# Patient Record
Sex: Female | Born: 1945 | Race: White | Hispanic: No | Smoking: Never smoker
Health system: Southern US, Community
[De-identification: ages and names within clinical notes are randomized; demographics above are authoritative.]

## PROBLEM LIST (undated history)

## (undated) DIAGNOSIS — C50919 Malignant neoplasm of unspecified site of unspecified female breast: Secondary | ICD-10-CM

## (undated) DIAGNOSIS — C801 Malignant (primary) neoplasm, unspecified: Secondary | ICD-10-CM

## (undated) DIAGNOSIS — F329 Major depressive disorder, single episode, unspecified: Secondary | ICD-10-CM

## (undated) DIAGNOSIS — M1990 Unspecified osteoarthritis, unspecified site: Secondary | ICD-10-CM

## (undated) DIAGNOSIS — F32A Depression, unspecified: Secondary | ICD-10-CM

## (undated) HISTORY — DX: Depression, unspecified: F32.A

## (undated) HISTORY — DX: Major depressive disorder, single episode, unspecified: F32.9

## (undated) HISTORY — DX: Malignant (primary) neoplasm, unspecified: C80.1

## (undated) HISTORY — DX: Unspecified osteoarthritis, unspecified site: M19.90

## (undated) HISTORY — DX: Malignant neoplasm of unspecified site of unspecified female breast: C50.919

---

## 1992-10-02 HISTORY — PX: MASTECTOMY: SHX3

## 2005-11-15 ENCOUNTER — Other Ambulatory Visit: Admission: RE | Admit: 2005-11-15 | Discharge: 2005-11-15 | Payer: Self-pay | Admitting: Obstetrics and Gynecology

## 2005-11-28 ENCOUNTER — Encounter: Payer: Self-pay | Admitting: Internal Medicine

## 2005-11-28 ENCOUNTER — Encounter: Admission: RE | Admit: 2005-11-28 | Discharge: 2005-11-28 | Payer: Self-pay | Admitting: Obstetrics and Gynecology

## 2005-12-13 ENCOUNTER — Ambulatory Visit: Payer: Self-pay | Admitting: Internal Medicine

## 2005-12-19 ENCOUNTER — Ambulatory Visit: Payer: Self-pay

## 2005-12-19 ENCOUNTER — Encounter: Payer: Self-pay | Admitting: Internal Medicine

## 2006-01-03 ENCOUNTER — Ambulatory Visit: Payer: Self-pay | Admitting: Internal Medicine

## 2006-12-27 ENCOUNTER — Encounter: Admission: RE | Admit: 2006-12-27 | Discharge: 2006-12-27 | Payer: Self-pay | Admitting: Obstetrics and Gynecology

## 2006-12-28 ENCOUNTER — Ambulatory Visit: Payer: Self-pay | Admitting: Internal Medicine

## 2007-01-14 ENCOUNTER — Encounter: Admission: RE | Admit: 2007-01-14 | Discharge: 2007-01-14 | Payer: Self-pay | Admitting: Obstetrics and Gynecology

## 2007-02-14 ENCOUNTER — Encounter: Payer: Self-pay | Admitting: Internal Medicine

## 2007-02-14 ENCOUNTER — Ambulatory Visit: Payer: Self-pay | Admitting: Internal Medicine

## 2007-02-14 LAB — CONVERTED CEMR LAB
Basophils Absolute: 0 10*3/uL (ref 0.0–0.1)
Basophils Relative: 0.6 % (ref 0.0–1.0)
Eosinophils Relative: 3.1 % (ref 0.0–5.0)
MCHC: 33.9 g/dL (ref 30.0–36.0)
Monocytes Relative: 5.9 % (ref 3.0–11.0)
Platelets: 191 10*3/uL (ref 150–400)
Prothrombin Time: 13 s (ref 11.6–15.2)
RBC: 3.94 M/uL (ref 3.87–5.11)
RDW: 12.2 % (ref 11.5–14.6)
aPTT: 33 s (ref 24–37)

## 2007-02-18 DIAGNOSIS — Z853 Personal history of malignant neoplasm of breast: Secondary | ICD-10-CM | POA: Insufficient documentation

## 2007-03-01 ENCOUNTER — Encounter: Payer: Self-pay | Admitting: Internal Medicine

## 2007-03-01 LAB — HM COLONOSCOPY

## 2007-04-30 ENCOUNTER — Encounter: Payer: Self-pay | Admitting: Internal Medicine

## 2007-05-01 ENCOUNTER — Telehealth (INDEPENDENT_AMBULATORY_CARE_PROVIDER_SITE_OTHER): Payer: Self-pay | Admitting: *Deleted

## 2007-07-24 ENCOUNTER — Ambulatory Visit: Payer: Self-pay | Admitting: Internal Medicine

## 2007-07-24 DIAGNOSIS — M159 Polyosteoarthritis, unspecified: Secondary | ICD-10-CM | POA: Insufficient documentation

## 2007-07-24 DIAGNOSIS — M25469 Effusion, unspecified knee: Secondary | ICD-10-CM

## 2007-09-19 ENCOUNTER — Ambulatory Visit: Payer: Self-pay | Admitting: Internal Medicine

## 2007-10-01 ENCOUNTER — Encounter: Payer: Self-pay | Admitting: Internal Medicine

## 2007-10-03 HISTORY — PX: BREAST LUMPECTOMY: SHX2

## 2007-10-03 HISTORY — PX: COLONOSCOPY: SHX174

## 2007-10-03 HISTORY — PX: KNEE ARTHROSCOPY: SUR90

## 2007-10-24 ENCOUNTER — Encounter: Payer: Self-pay | Admitting: Internal Medicine

## 2008-01-15 ENCOUNTER — Encounter: Admission: RE | Admit: 2008-01-15 | Discharge: 2008-01-15 | Payer: Self-pay | Admitting: Internal Medicine

## 2008-01-15 ENCOUNTER — Encounter: Payer: Self-pay | Admitting: Internal Medicine

## 2008-01-21 ENCOUNTER — Encounter (INDEPENDENT_AMBULATORY_CARE_PROVIDER_SITE_OTHER): Payer: Self-pay | Admitting: *Deleted

## 2008-01-27 ENCOUNTER — Encounter (INDEPENDENT_AMBULATORY_CARE_PROVIDER_SITE_OTHER): Payer: Self-pay | Admitting: Diagnostic Radiology

## 2008-01-27 ENCOUNTER — Encounter: Payer: Self-pay | Admitting: Internal Medicine

## 2008-01-27 ENCOUNTER — Encounter: Admission: RE | Admit: 2008-01-27 | Discharge: 2008-01-27 | Payer: Self-pay | Admitting: Internal Medicine

## 2008-01-28 ENCOUNTER — Telehealth: Payer: Self-pay | Admitting: Internal Medicine

## 2008-01-29 ENCOUNTER — Telehealth: Payer: Self-pay | Admitting: Internal Medicine

## 2008-01-29 ENCOUNTER — Encounter: Payer: Self-pay | Admitting: Internal Medicine

## 2008-01-30 ENCOUNTER — Telehealth: Payer: Self-pay | Admitting: Internal Medicine

## 2008-01-31 ENCOUNTER — Ambulatory Visit: Payer: Self-pay | Admitting: Internal Medicine

## 2008-02-05 ENCOUNTER — Encounter: Payer: Self-pay | Admitting: Internal Medicine

## 2008-02-05 ENCOUNTER — Encounter: Admission: RE | Admit: 2008-02-05 | Discharge: 2008-02-05 | Payer: Self-pay | Admitting: Internal Medicine

## 2008-02-07 ENCOUNTER — Ambulatory Visit: Payer: Self-pay | Admitting: Oncology

## 2008-02-11 ENCOUNTER — Encounter: Admission: RE | Admit: 2008-02-11 | Discharge: 2008-02-11 | Payer: Self-pay | Admitting: Obstetrics and Gynecology

## 2008-02-11 ENCOUNTER — Ambulatory Visit (HOSPITAL_BASED_OUTPATIENT_CLINIC_OR_DEPARTMENT_OTHER): Admission: RE | Admit: 2008-02-11 | Discharge: 2008-02-11 | Payer: Self-pay | Admitting: Surgery

## 2008-02-11 ENCOUNTER — Encounter (INDEPENDENT_AMBULATORY_CARE_PROVIDER_SITE_OTHER): Payer: Self-pay | Admitting: *Deleted

## 2008-02-11 ENCOUNTER — Encounter: Payer: Self-pay | Admitting: Internal Medicine

## 2008-02-11 ENCOUNTER — Encounter (INDEPENDENT_AMBULATORY_CARE_PROVIDER_SITE_OTHER): Payer: Self-pay | Admitting: Surgery

## 2008-03-04 ENCOUNTER — Encounter: Payer: Self-pay | Admitting: Internal Medicine

## 2008-03-04 LAB — CBC WITH DIFFERENTIAL/PLATELET
EOS%: 3.5 % (ref 0.0–7.0)
LYMPH%: 36 % (ref 14.0–48.0)
MCH: 31.5 pg (ref 26.0–34.0)
MCHC: 34.7 g/dL (ref 32.0–36.0)
MCV: 90.8 fL (ref 81.0–101.0)
MONO%: 8.5 % (ref 0.0–13.0)
Platelets: 203 10*3/uL (ref 145–400)
RBC: 3.78 10*6/uL (ref 3.70–5.32)
RDW: 13 % (ref 11.3–14.5)

## 2008-03-05 ENCOUNTER — Ambulatory Visit (HOSPITAL_COMMUNITY): Admission: RE | Admit: 2008-03-05 | Discharge: 2008-03-05 | Payer: Self-pay | Admitting: Oncology

## 2008-03-05 LAB — COMPREHENSIVE METABOLIC PANEL
AST: 18 U/L (ref 0–37)
Albumin: 4.1 g/dL (ref 3.5–5.2)
Alkaline Phosphatase: 69 U/L (ref 39–117)
Potassium: 3.5 mEq/L (ref 3.5–5.3)
Sodium: 138 mEq/L (ref 135–145)
Total Bilirubin: 0.5 mg/dL (ref 0.3–1.2)
Total Protein: 6.8 g/dL (ref 6.0–8.3)

## 2008-03-05 LAB — VITAMIN D 25 HYDROXY (VIT D DEFICIENCY, FRACTURES): Vit D, 25-Hydroxy: 23 ng/mL — ABNORMAL LOW (ref 30–89)

## 2008-03-11 ENCOUNTER — Ambulatory Visit: Admission: RE | Admit: 2008-03-11 | Discharge: 2008-06-09 | Payer: Self-pay | Admitting: Radiation Oncology

## 2008-03-12 ENCOUNTER — Encounter: Payer: Self-pay | Admitting: Internal Medicine

## 2008-03-26 ENCOUNTER — Ambulatory Visit: Payer: Self-pay | Admitting: Oncology

## 2008-03-26 ENCOUNTER — Encounter: Payer: Self-pay | Admitting: Internal Medicine

## 2008-05-22 ENCOUNTER — Ambulatory Visit: Payer: Self-pay | Admitting: Oncology

## 2008-05-27 LAB — COMPREHENSIVE METABOLIC PANEL
ALT: 21 U/L (ref 0–35)
AST: 23 U/L (ref 0–37)
Albumin: 4.3 g/dL (ref 3.5–5.2)
BUN: 9 mg/dL (ref 6–23)
Calcium: 9.6 mg/dL (ref 8.4–10.5)
Chloride: 105 mEq/L (ref 96–112)
Potassium: 3.9 mEq/L (ref 3.5–5.3)
Sodium: 141 mEq/L (ref 135–145)
Total Protein: 7.3 g/dL (ref 6.0–8.3)

## 2008-05-28 ENCOUNTER — Ambulatory Visit: Payer: Self-pay | Admitting: Internal Medicine

## 2008-05-28 DIAGNOSIS — H9319 Tinnitus, unspecified ear: Secondary | ICD-10-CM | POA: Insufficient documentation

## 2008-06-02 ENCOUNTER — Encounter: Payer: Self-pay | Admitting: Internal Medicine

## 2008-10-22 ENCOUNTER — Ambulatory Visit: Payer: Self-pay | Admitting: Internal Medicine

## 2008-11-05 ENCOUNTER — Encounter: Payer: Self-pay | Admitting: Internal Medicine

## 2008-11-26 ENCOUNTER — Encounter: Admission: RE | Admit: 2008-11-26 | Discharge: 2009-01-25 | Payer: Self-pay | Admitting: Orthopedic Surgery

## 2008-11-26 ENCOUNTER — Ambulatory Visit: Payer: Self-pay | Admitting: Oncology

## 2008-11-30 ENCOUNTER — Encounter: Payer: Self-pay | Admitting: Internal Medicine

## 2008-11-30 LAB — CBC WITH DIFFERENTIAL/PLATELET
Basophils Absolute: 0 10*3/uL (ref 0.0–0.1)
Eosinophils Absolute: 0.1 10*3/uL (ref 0.0–0.5)
HGB: 12.9 g/dL (ref 11.6–15.9)
MCV: 90.2 fL (ref 79.5–101.0)
MONO%: 6.1 % (ref 0.0–14.0)
NEUT#: 4.7 10*3/uL (ref 1.5–6.5)
RBC: 4.18 10*6/uL (ref 3.70–5.45)
RDW: 13.6 % (ref 11.2–14.5)
WBC: 6.7 10*3/uL (ref 3.9–10.3)
lymph#: 1.5 10*3/uL (ref 0.9–3.3)

## 2008-12-01 LAB — COMPREHENSIVE METABOLIC PANEL
ALT: 19 U/L (ref 0–35)
BUN: 12 mg/dL (ref 6–23)
CO2: 22 mEq/L (ref 19–32)
Creatinine, Ser: 0.64 mg/dL (ref 0.40–1.20)
Total Bilirubin: 0.3 mg/dL (ref 0.3–1.2)

## 2008-12-01 LAB — CANCER ANTIGEN 27.29: CA 27.29: 18 U/mL (ref 0–39)

## 2008-12-10 ENCOUNTER — Encounter: Payer: Self-pay | Admitting: Internal Medicine

## 2008-12-18 ENCOUNTER — Ambulatory Visit: Payer: Self-pay | Admitting: Internal Medicine

## 2008-12-31 ENCOUNTER — Ambulatory Visit: Payer: Self-pay | Admitting: Internal Medicine

## 2008-12-31 DIAGNOSIS — M79609 Pain in unspecified limb: Secondary | ICD-10-CM | POA: Insufficient documentation

## 2008-12-31 DIAGNOSIS — M949 Disorder of cartilage, unspecified: Secondary | ICD-10-CM

## 2008-12-31 DIAGNOSIS — M899 Disorder of bone, unspecified: Secondary | ICD-10-CM | POA: Insufficient documentation

## 2009-01-05 ENCOUNTER — Encounter (INDEPENDENT_AMBULATORY_CARE_PROVIDER_SITE_OTHER): Payer: Self-pay | Admitting: *Deleted

## 2009-01-19 ENCOUNTER — Encounter: Admission: RE | Admit: 2009-01-19 | Discharge: 2009-01-19 | Payer: Self-pay | Admitting: Radiation Oncology

## 2009-02-08 ENCOUNTER — Telehealth (INDEPENDENT_AMBULATORY_CARE_PROVIDER_SITE_OTHER): Payer: Self-pay | Admitting: *Deleted

## 2009-03-02 ENCOUNTER — Ambulatory Visit: Payer: Self-pay | Admitting: Oncology

## 2009-03-04 ENCOUNTER — Encounter: Payer: Self-pay | Admitting: Internal Medicine

## 2009-03-04 LAB — COMPREHENSIVE METABOLIC PANEL
Albumin: 3.7 g/dL (ref 3.5–5.2)
Alkaline Phosphatase: 94 U/L (ref 39–117)
BUN: 12 mg/dL (ref 6–23)
CO2: 29 mEq/L (ref 19–32)
Calcium: 8.8 mg/dL (ref 8.4–10.5)
Chloride: 105 mEq/L (ref 96–112)
Glucose, Bld: 94 mg/dL (ref 70–99)
Potassium: 3.7 mEq/L (ref 3.5–5.3)
Sodium: 138 mEq/L (ref 135–145)
Total Protein: 6.9 g/dL (ref 6.0–8.3)

## 2009-03-04 LAB — CBC WITH DIFFERENTIAL/PLATELET
Basophils Absolute: 0 10*3/uL (ref 0.0–0.1)
Eosinophils Absolute: 0.2 10*3/uL (ref 0.0–0.5)
HGB: 12.3 g/dL (ref 11.6–15.9)
MCV: 90.8 fL (ref 79.5–101.0)
MONO#: 0.4 10*3/uL (ref 0.1–0.9)
MONO%: 8.3 % (ref 0.0–14.0)
NEUT#: 3.1 10*3/uL (ref 1.5–6.5)
RBC: 3.98 10*6/uL (ref 3.70–5.45)
RDW: 12.9 % (ref 11.2–14.5)
WBC: 5.2 10*3/uL (ref 3.9–10.3)
lymph#: 1.4 10*3/uL (ref 0.9–3.3)

## 2009-03-15 ENCOUNTER — Ambulatory Visit: Payer: Self-pay | Admitting: Internal Medicine

## 2009-03-15 DIAGNOSIS — R110 Nausea: Secondary | ICD-10-CM

## 2009-03-16 ENCOUNTER — Encounter: Payer: Self-pay | Admitting: Internal Medicine

## 2009-03-20 LAB — CONVERTED CEMR LAB
ALT: 19 units/L (ref 0–35)
Albumin: 3.9 g/dL (ref 3.5–5.2)
Alkaline Phosphatase: 92 units/L (ref 39–117)
BUN: 15 mg/dL (ref 6–23)
Basophils Absolute: 0 10*3/uL (ref 0.0–0.1)
Bilirubin, Direct: 0.1 mg/dL (ref 0.0–0.3)
Calcium: 9.3 mg/dL (ref 8.4–10.5)
Chloride: 100 meq/L (ref 96–112)
Creatinine, Ser: 0.7 mg/dL (ref 0.4–1.2)
Eosinophils Relative: 2.2 % (ref 0.0–5.0)
Hemoglobin: 12.5 g/dL (ref 12.0–15.0)
Lymphocytes Relative: 24.2 % (ref 12.0–46.0)
Lymphs Abs: 1.5 10*3/uL (ref 0.7–4.0)
Neutro Abs: 4.1 10*3/uL (ref 1.4–7.7)
Neutrophils Relative %: 68.3 % (ref 43.0–77.0)
Platelets: 176 10*3/uL (ref 150.0–400.0)
Potassium: 4.2 meq/L (ref 3.5–5.1)
RDW: 12 % (ref 11.5–14.6)
Sodium: 139 meq/L (ref 135–145)
Total Bilirubin: 0.7 mg/dL (ref 0.3–1.2)

## 2009-03-23 ENCOUNTER — Encounter (INDEPENDENT_AMBULATORY_CARE_PROVIDER_SITE_OTHER): Payer: Self-pay | Admitting: *Deleted

## 2009-06-23 ENCOUNTER — Ambulatory Visit: Payer: Self-pay | Admitting: Internal Medicine

## 2009-06-23 DIAGNOSIS — M6740 Ganglion, unspecified site: Secondary | ICD-10-CM | POA: Insufficient documentation

## 2009-07-08 ENCOUNTER — Ambulatory Visit: Payer: Self-pay | Admitting: Internal Medicine

## 2009-07-29 ENCOUNTER — Encounter: Payer: Self-pay | Admitting: Internal Medicine

## 2009-08-18 ENCOUNTER — Encounter: Payer: Self-pay | Admitting: Internal Medicine

## 2009-09-08 ENCOUNTER — Encounter: Payer: Self-pay | Admitting: Internal Medicine

## 2009-09-13 ENCOUNTER — Ambulatory Visit: Payer: Self-pay | Admitting: Internal Medicine

## 2009-09-13 DIAGNOSIS — F39 Unspecified mood [affective] disorder: Secondary | ICD-10-CM | POA: Insufficient documentation

## 2009-09-14 ENCOUNTER — Encounter (INDEPENDENT_AMBULATORY_CARE_PROVIDER_SITE_OTHER): Payer: Self-pay | Admitting: *Deleted

## 2009-09-14 LAB — CONVERTED CEMR LAB
ALT: 19 units/L (ref 0–35)
AST: 25 units/L (ref 0–37)
BUN: 12 mg/dL (ref 6–23)
Basophils Absolute: 0 10*3/uL (ref 0.0–0.1)
Bilirubin, Direct: 0.1 mg/dL (ref 0.0–0.3)
Calcium: 9.1 mg/dL (ref 8.4–10.5)
Cholesterol: 212 mg/dL — ABNORMAL HIGH (ref 0–200)
Creatinine, Ser: 0.6 mg/dL (ref 0.4–1.2)
GFR calc non Af Amer: 107.21 mL/min (ref >60)
HCT: 38.4 % (ref 36.0–46.0)
Hemoglobin: 13.2 g/dL (ref 12.0–15.0)
Lymphs Abs: 1.4 10*3/uL (ref 0.7–4.0)
Monocytes Absolute: 0.4 10*3/uL (ref 0.1–1.0)
Neutrophils Relative %: 66.6 % (ref 43.0–77.0)
RDW: 13.1 % (ref 11.5–14.6)
Sodium: 142 meq/L (ref 135–145)
TSH: 1.2 microintl units/mL (ref 0.35–5.50)
Total Bilirubin: 0.9 mg/dL (ref 0.3–1.2)
Total CHOL/HDL Ratio: 2
WBC: 5.8 10*3/uL (ref 4.5–10.5)

## 2009-10-27 ENCOUNTER — Ambulatory Visit: Payer: Self-pay | Admitting: Oncology

## 2009-10-27 LAB — COMPREHENSIVE METABOLIC PANEL
ALT: 23 U/L (ref 0–35)
AST: 21 U/L (ref 0–37)
CO2: 31 mEq/L (ref 19–32)
Calcium: 9.1 mg/dL (ref 8.4–10.5)
Chloride: 102 mEq/L (ref 96–112)
Creatinine, Ser: 0.71 mg/dL (ref 0.40–1.20)
Potassium: 3.7 mEq/L (ref 3.5–5.3)
Sodium: 139 mEq/L (ref 135–145)
Total Protein: 7.4 g/dL (ref 6.0–8.3)

## 2009-10-27 LAB — CBC WITH DIFFERENTIAL/PLATELET
BASO%: 0.3 % (ref 0.0–2.0)
Basophils Absolute: 0 10*3/uL (ref 0.0–0.1)
EOS%: 1.8 % (ref 0.0–7.0)
HGB: 12.9 g/dL (ref 11.6–15.9)
MCH: 31.9 pg (ref 25.1–34.0)
MCHC: 34.1 g/dL (ref 31.5–36.0)
MCV: 93.5 fL (ref 79.5–101.0)
MONO%: 8.1 % (ref 0.0–14.0)
NEUT%: 55.6 % (ref 38.4–76.8)
RDW: 12.8 % (ref 11.2–14.5)
lymph#: 1.9 10*3/uL (ref 0.9–3.3)

## 2009-10-27 LAB — CANCER ANTIGEN 27.29: CA 27.29: 16 U/mL (ref 0–39)

## 2009-11-03 ENCOUNTER — Encounter: Payer: Self-pay | Admitting: Internal Medicine

## 2009-11-15 ENCOUNTER — Inpatient Hospital Stay (HOSPITAL_COMMUNITY): Admission: RE | Admit: 2009-11-15 | Discharge: 2009-11-17 | Payer: Self-pay | Admitting: Orthopedic Surgery

## 2009-11-15 HISTORY — PX: JOINT REPLACEMENT: SHX530

## 2009-12-02 ENCOUNTER — Encounter: Admission: RE | Admit: 2009-12-02 | Discharge: 2010-01-25 | Payer: Self-pay | Admitting: Orthopedic Surgery

## 2010-01-20 ENCOUNTER — Encounter: Admission: RE | Admit: 2010-01-20 | Discharge: 2010-01-20 | Payer: Self-pay | Admitting: Oncology

## 2010-01-20 LAB — HM MAMMOGRAPHY

## 2010-02-28 ENCOUNTER — Ambulatory Visit: Payer: Self-pay | Admitting: Diagnostic Radiology

## 2010-02-28 ENCOUNTER — Emergency Department (HOSPITAL_BASED_OUTPATIENT_CLINIC_OR_DEPARTMENT_OTHER): Admission: EM | Admit: 2010-02-28 | Discharge: 2010-02-28 | Payer: Self-pay | Admitting: Emergency Medicine

## 2010-03-31 ENCOUNTER — Encounter: Payer: Self-pay | Admitting: Internal Medicine

## 2010-04-29 ENCOUNTER — Ambulatory Visit: Payer: Self-pay | Admitting: Oncology

## 2010-05-03 LAB — CBC WITH DIFFERENTIAL/PLATELET
Basophils Absolute: 0 10*3/uL (ref 0.0–0.1)
EOS%: 1.8 % (ref 0.0–7.0)
Eosinophils Absolute: 0.1 10*3/uL (ref 0.0–0.5)
LYMPH%: 31.4 % (ref 14.0–49.7)
MCH: 32.1 pg (ref 25.1–34.0)
MCV: 92.8 fL (ref 79.5–101.0)
MONO%: 7.4 % (ref 0.0–14.0)
NEUT#: 3.3 10*3/uL (ref 1.5–6.5)
Platelets: 181 10*3/uL (ref 145–400)
RBC: 3.95 10*6/uL (ref 3.70–5.45)

## 2010-05-03 LAB — COMPREHENSIVE METABOLIC PANEL
AST: 23 U/L (ref 0–37)
Alkaline Phosphatase: 87 U/L (ref 39–117)
BUN: 14 mg/dL (ref 6–23)
Glucose, Bld: 90 mg/dL (ref 70–99)
Sodium: 138 mEq/L (ref 135–145)
Total Bilirubin: 0.8 mg/dL (ref 0.3–1.2)

## 2010-05-04 LAB — VITAMIN D 25 HYDROXY (VIT D DEFICIENCY, FRACTURES): Vit D, 25-Hydroxy: 31 ng/mL (ref 30–89)

## 2010-05-09 ENCOUNTER — Encounter: Payer: Self-pay | Admitting: Internal Medicine

## 2010-05-16 ENCOUNTER — Encounter: Payer: Self-pay | Admitting: Internal Medicine

## 2010-07-01 ENCOUNTER — Encounter: Payer: Self-pay | Admitting: Internal Medicine

## 2010-08-02 ENCOUNTER — Encounter: Payer: Self-pay | Admitting: Internal Medicine

## 2010-10-23 ENCOUNTER — Encounter: Payer: Self-pay | Admitting: Obstetrics and Gynecology

## 2010-10-23 ENCOUNTER — Encounter: Payer: Self-pay | Admitting: Radiation Oncology

## 2010-11-01 NOTE — Letter (Signed)
[facility]   Imported By: Lanelle Bal 11/20/2009 09:48:19  _____________________________________________________________________  External Attachment:    Type:   Image     Comment:   External Document

## 2010-11-01 NOTE — Letter (Signed)
[facility]   Imported By: Lanelle Bal 05/30/2010 12:23:15  _____________________________________________________________________  External Attachment:    Type:   Image     Comment:   External Document

## 2010-11-01 NOTE — Miscellaneous (Signed)
Summary: Orders Update  Clinical Lists Changes  Medications: Removed medication of CITALOPRAM HYDROBROMIDE 20 MG TABS (CITALOPRAM HYDROBROMIDE) 1 q am; do not take within 8 hrs of Tramadol Added new medication of FLUOXETINE HCL 20 MG CAPS (FLUOXETINE HCL) 1 once daily - Signed Rx of FLUOXETINE HCL 20 MG CAPS (FLUOXETINE HCL) 1 once daily;  #30 x 5;  Signed;  Entered by: Marga Melnick MD;  Authorized by: Marga Melnick MD;  Method used: Print then Give to Patient    Prescriptions: FLUOXETINE HCL 20 MG CAPS (FLUOXETINE HCL) 1 once daily  #30 x 5   Entered and Authorized by:   Marga Melnick MD   Signed by:   Marga Melnick MD on 08/02/2010   Method used:   Print then Give to Patient   RxID:   [id number]

## 2010-11-01 NOTE — Miscellaneous (Signed)
Summary: Living Will  Living Will   Imported By: Lanelle Bal 08/16/2010 11:46:19  _____________________________________________________________________  External Attachment:    Type:   Image     Comment:   External Document

## 2010-11-01 NOTE — Letter (Signed)
Summary: [facility]  [facility]   Imported By: Lanelle Bal 05/26/2010 14:15:06  _____________________________________________________________________  External Attachment:    Type:   Image     Comment:   External Document

## 2010-11-01 NOTE — Letter (Signed)
Summary: [facility]  [facility]   Imported By: Lanelle Bal 04/11/2010 09:13:46  _____________________________________________________________________  External Attachment:    Type:   Image     Comment:   External Document

## 2010-11-01 NOTE — Miscellaneous (Signed)
Summary: [facility] Care Power of [facility] Power of Attorney   Imported By: Lanelle Bal 08/16/2010 11:48:59  _____________________________________________________________________  External Attachment:    Type:   Image     Comment:   External Document

## 2010-11-14 ENCOUNTER — Encounter: Payer: Self-pay | Admitting: Internal Medicine

## 2010-11-29 ENCOUNTER — Encounter: Payer: Self-pay | Admitting: Internal Medicine

## 2010-11-29 ENCOUNTER — Ambulatory Visit (INDEPENDENT_AMBULATORY_CARE_PROVIDER_SITE_OTHER): Payer: Federal, State, Local not specified - PPO | Admitting: Internal Medicine

## 2010-11-29 DIAGNOSIS — J019 Acute sinusitis, unspecified: Secondary | ICD-10-CM

## 2010-11-29 DIAGNOSIS — J209 Acute bronchitis, unspecified: Secondary | ICD-10-CM

## 2010-11-29 NOTE — Letter (Signed)
Summary: [facility]   Imported By: Maryln Gottron 11/23/2010 10:41:48  _____________________________________________________________________  External Attachment:    Type:   Image     Comment:   External Document

## 2010-12-08 NOTE — Assessment & Plan Note (Signed)
Summary: cold , cough for 3-4 days///sph   Vital Signs:  Patient profile:   65 year old female Weight:      155.2 pounds BMI:     27.37 Temp:     98.2 degrees F oral Pulse rate:   76 / minute Resp:     14 per minute BP sitting:   110 / 60  (right arm) Cuff size:   large  Vitals Entered By: Shonna Chock CMA (November 29, 2010 8:03 AM) CC: URI symptoms x 1 week   CC:  URI symptoms x 1 week.  History of Present Illness:    Onset as 11/21/2010 as ST  with facial pain, low grade fever , & rhinitis with clear secretions.Now she describes copious  purulent nasal discharge and productive cough with some dyspnea, but denies earache.  The patient denies fever,  and wheezing.  The patient denies headache.  The patient denies the following risk factors for Strep sinusitis: unilateral facial pain, tooth pain, and tender adenopathy. No PMH of asthma; non smoker. Rx: Nyquil  Current Medications (verified): 1)  Boniva 150 Mg  Tabs (Ibandronate Sodium) .... Take One Tablet Monthly 2)  Arimidex 1 Mg Tabs (Anastrozole) .Marland Kitchen.. 1 By Mouth Once Daily 3)  Fluoxetine Hcl 20 Mg Caps (Fluoxetine Hcl) .Marland Kitchen.. 1 Once Daily  Allergies (verified): No Known Drug Allergies  Physical Exam  General:  well-nourished,in no acute distress; alert,appropriate and cooperative throughout examination Ears:  External ear exam shows no significant lesions or deformities.  Otoscopic examination reveals clear canals, tympanic membranes are intact bilaterally without bulging, retraction, inflammation or discharge. Hearing is grossly normal bilaterally. Nose:  External nasal examination shows no deformity or inflammation. Nasal mucosa are  slightly dry without lesions or exudates. Mouth:  Oral mucosa and oropharynx without lesions or exudates.  Teeth in good repair. Lungs:  Normal respiratory effort, chest expands symmetrically. Lungs : minima rhonchi , no crackles or wheezes.Harsh barking cough Heart:  Normal rate and regular  rhythm. S1 and S2 normal without gallop, murmur, click, rub . Cervical Nodes:  No lymphadenopathy noted Axillary Nodes:  No palpable lymphadenopathy   Impression & Recommendations:  Problem # 1:  BRONCHITIS-ACUTE (ICD-466.0)  Her updated medication list for this problem includes:    Amoxicillin-pot Clavulanate 875-125 Mg Tabs (Amoxicillin-pot clavulanate) .Marland Kitchen... 1 every 12 hrs with a meal    Hydromet 5-1.5 Mg/78ml Syrp (Hydrocodone-homatropine) .Marland Kitchen... 1 tsp every 6 hrs as needed    Advair Diskus 100-50 Mcg/dose Aepb (Fluticasone-salmeterol) .Marland Kitchen... 1 inhalation every 12 hrs ; gargle & spit after use  Problem # 2:  SINUSITIS- ACUTE-NOS (ICD-461.9)  Her updated medication list for this problem includes:    Amoxicillin-pot Clavulanate 875-125 Mg Tabs (Amoxicillin-pot clavulanate) .Marland Kitchen... 1 every 12 hrs with a meal    Hydromet 5-1.5 Mg/37ml Syrp (Hydrocodone-homatropine) .Marland Kitchen... 1 tsp every 6 hrs as needed  Complete Medication List: 1)  Boniva 150 Mg Tabs (Ibandronate sodium) .... Take one tablet monthly 2)  Arimidex 1 Mg Tabs (Anastrozole) .Marland Kitchen.. 1 by mouth once daily 3)  Fluoxetine Hcl 20 Mg Caps (Fluoxetine hcl) .Marland Kitchen.. 1 once daily 4)  Amoxicillin-pot Clavulanate 875-125 Mg Tabs (Amoxicillin-pot clavulanate) .Marland Kitchen.. 1 every 12 hrs with a meal 5)  Prednisone 20 Mg Tabs (Prednisone) .Marland Kitchen.. 1 two times a day with food 6)  Hydromet 5-1.5 Mg/54ml Syrp (Hydrocodone-homatropine) .Marland Kitchen.. 1 tsp every 6 hrs as needed 7)  Advair Diskus 100-50 Mcg/dose Aepb (Fluticasone-salmeterol) .Marland Kitchen.. 1 inhalation every 12 hrs ; gargle & spit  after use  Patient Instructions: 1)  Recommended remaining out of work for 11/29/2010. 2)  Drink as much fluid as you can tolerate for the next few days. Prescriptions: ADVAIR DISKUS 100-50 MCG/DOSE AEPB (FLUTICASONE-SALMETEROL) 1 inhalation every 12 hrs ; gargle & spit after use  #1 x 0   Entered and Authorized by:   Marga Melnick MD   Signed by:   Marga Melnick MD on 11/29/2010   Method  used:   Samples Given   RxID:   [id number] HYDROMET 5-1.5 MG/5ML SYRP (HYDROCODONE-HOMATROPINE) 1 tsp every 6 hrs as needed  #120cc x 0   Entered and Authorized by:   Marga Melnick MD   Signed by:   Marga Melnick MD on 11/29/2010   Method used:   Printed then faxed to ...       [facility] [phone]* (retail)       [address]       [place], [place]  [address]       Ph: [phone]       Fax: [phone]   RxID:   [id number] PREDNISONE 20 MG TABS (PREDNISONE) 1 two times a day with food  #14 x 0   Entered and Authorized by:   Marga Melnick MD   Signed by:   Marga Melnick MD on 11/29/2010   Method used:   Electronically to        [facility] [phone]* (retail)       [address]       [place], [place]  [address]       Ph: [phone]       Fax: [phone]   RxID:   [id number] AMOXICILLIN-POT CLAVULANATE 875-125 MG TABS (AMOXICILLIN-POT CLAVULANATE) 1 every 12 hrs with a meal  #20 x 0   Entered and Authorized by:   Marga Melnick MD   Signed by:   Marga Melnick MD on 11/29/2010   Method used:   [facility]ronically to        [facility] [phone]* (retail)       [address]       [place], [place]  [address]       Ph: [phone]       Fax: [phone]   RxID:   [id number]    Orders Added: 1)  Est. Patient Level III [93818]

## 2010-12-19 ENCOUNTER — Other Ambulatory Visit: Payer: Self-pay | Admitting: Obstetrics and Gynecology

## 2010-12-19 DIAGNOSIS — Z9012 Acquired absence of left breast and nipple: Secondary | ICD-10-CM

## 2010-12-19 DIAGNOSIS — Z9889 Other specified postprocedural states: Secondary | ICD-10-CM

## 2010-12-19 DIAGNOSIS — Z1231 Encounter for screening mammogram for malignant neoplasm of breast: Secondary | ICD-10-CM

## 2010-12-19 LAB — URINALYSIS, ROUTINE W REFLEX MICROSCOPIC
Bilirubin Urine: NEGATIVE
Ketones, ur: 15 mg/dL — AB
Nitrite: NEGATIVE
pH: 5 (ref 5.0–8.0)

## 2010-12-19 LAB — DIFFERENTIAL
Basophils Absolute: 0 10*3/uL (ref 0.0–0.1)
Eosinophils Relative: 2 % (ref 0–5)
Lymphocytes Relative: 34 % (ref 12–46)
Monocytes Absolute: 0.6 10*3/uL (ref 0.1–1.0)

## 2010-12-19 LAB — CBC
HCT: 37.7 % (ref 36.0–46.0)
Hemoglobin: 12.8 g/dL (ref 12.0–15.0)
MCHC: 33.9 g/dL (ref 30.0–36.0)
RDW: 13 % (ref 11.5–15.5)

## 2010-12-19 LAB — BASIC METABOLIC PANEL
CO2: 24 mEq/L (ref 19–32)
Chloride: 107 mEq/L (ref 96–112)
GFR calc Af Amer: >60 mL/min (ref >60)
Potassium: 3.4 mEq/L — ABNORMAL LOW (ref 3.5–5.1)
Sodium: 142 mEq/L (ref 135–145)

## 2010-12-21 LAB — CBC
HCT: 30.2 % — ABNORMAL LOW (ref 36.0–46.0)
HCT: 39.8 % (ref 36.0–46.0)
Hemoglobin: 10.4 g/dL — ABNORMAL LOW (ref 12.0–15.0)
Hemoglobin: 13.6 g/dL (ref 12.0–15.0)
MCHC: 34.3 g/dL (ref 30.0–36.0)
MCHC: 34.4 g/dL (ref 30.0–36.0)
MCV: 94.2 fL (ref 78.0–100.0)
MCV: 94.8 fL (ref 78.0–100.0)
Platelets: 100 10*3/uL — ABNORMAL LOW (ref 150–400)
Platelets: 98 10*3/uL — ABNORMAL LOW (ref 150–400)
RBC: 2.34 MIL/uL — ABNORMAL LOW (ref 3.87–5.11)
RBC: 3.27 MIL/uL — ABNORMAL LOW (ref 3.87–5.11)
RDW: 13 % (ref 11.5–15.5)
WBC: 5.2 10*3/uL (ref 4.0–10.5)

## 2010-12-21 LAB — BASIC METABOLIC PANEL
CO2: 28 mEq/L (ref 19–32)
CO2: 30 mEq/L (ref 19–32)
Calcium: 8.1 mg/dL — ABNORMAL LOW (ref 8.4–10.5)
Chloride: 105 mEq/L (ref 96–112)
Chloride: 107 mEq/L (ref 96–112)
GFR calc Af Amer: >60 mL/min (ref >60)
GFR calc Af Amer: >60 mL/min (ref >60)
Glucose, Bld: 130 mg/dL — ABNORMAL HIGH (ref 70–99)
Potassium: 3.6 mEq/L (ref 3.5–5.1)
Potassium: 3.7 mEq/L (ref 3.5–5.1)
Sodium: 137 mEq/L (ref 135–145)
Sodium: 142 mEq/L (ref 135–145)

## 2010-12-21 LAB — URINALYSIS, ROUTINE W REFLEX MICROSCOPIC
Bilirubin Urine: NEGATIVE
Nitrite: NEGATIVE
Protein, ur: NEGATIVE mg/dL
Specific Gravity, Urine: 1.014 (ref 1.005–1.030)
Urobilinogen, UA: 1 mg/dL (ref 0.0–1.0)

## 2010-12-21 LAB — CROSSMATCH: ABO/RH(D): O NEG

## 2010-12-21 LAB — COMPREHENSIVE METABOLIC PANEL
BUN: 9 mg/dL (ref 6–23)
Calcium: 9.4 mg/dL (ref 8.4–10.5)
Creatinine, Ser: 0.59 mg/dL (ref 0.4–1.2)
Glucose, Bld: 90 mg/dL (ref 70–99)
Total Protein: 7.3 g/dL (ref 6.0–8.3)

## 2010-12-21 LAB — DIFFERENTIAL
Lymphs Abs: 1.4 10*3/uL (ref 0.7–4.0)
Monocytes Relative: 11 % (ref 3–12)
Neutro Abs: 2.5 10*3/uL (ref 1.7–7.7)
Neutrophils Relative %: 56 % (ref 43–77)

## 2010-12-21 LAB — APTT: aPTT: 29 seconds (ref 24–37)

## 2010-12-21 LAB — ABO/RH: ABO/RH(D): O NEG

## 2010-12-21 LAB — PROTIME-INR
INR: 0.96 (ref 0.00–1.49)
Prothrombin Time: 12.7 seconds (ref 11.6–15.2)

## 2010-12-21 LAB — URINE CULTURE

## 2010-12-28 ENCOUNTER — Encounter: Payer: Self-pay | Admitting: Internal Medicine

## 2010-12-29 ENCOUNTER — Ambulatory Visit (INDEPENDENT_AMBULATORY_CARE_PROVIDER_SITE_OTHER): Payer: Federal, State, Local not specified - PPO | Admitting: Internal Medicine

## 2010-12-29 ENCOUNTER — Encounter: Payer: Self-pay | Admitting: Internal Medicine

## 2010-12-29 ENCOUNTER — Ambulatory Visit: Payer: Federal, State, Local not specified - PPO | Admitting: Internal Medicine

## 2010-12-29 VITALS — BP 122/64 | HR 80 | Temp 99.1°F | Wt 155.6 lb

## 2010-12-29 DIAGNOSIS — K121 Other forms of stomatitis: Secondary | ICD-10-CM

## 2010-12-29 DIAGNOSIS — K1230 Oral mucositis (ulcerative), unspecified: Secondary | ICD-10-CM

## 2010-12-29 DIAGNOSIS — Z853 Personal history of malignant neoplasm of breast: Secondary | ICD-10-CM

## 2010-12-29 LAB — CBC WITH DIFFERENTIAL/PLATELET
Basophils Absolute: 0 10*3/uL (ref 0.0–0.1)
Hemoglobin: 12.6 g/dL (ref 12.0–15.0)
Lymphocytes Relative: 19.3 % (ref 12.0–46.0)
Monocytes Relative: 9.1 % (ref 3.0–12.0)
Neutro Abs: 5.3 10*3/uL (ref 1.4–7.7)
RBC: 3.9 Mil/uL (ref 3.87–5.11)
RDW: 12.9 % (ref 11.5–14.6)

## 2010-12-29 MED ORDER — ACYCLOVIR 5 % EX OINT: TOPICAL_OINTMENT | CUTANEOUS | Status: AC

## 2010-12-29 NOTE — Patient Instructions (Signed)
Apply the ointment to the lips every 3 hours while awake. Please report warning signs such as high fevers chills or purulent secretions. Please remain out of work today and tomorrow 12/30/2010.

## 2010-12-29 NOTE — Progress Notes (Signed)
  Subjective:    Patient ID: Brooke Carpenter, female    DOB: [birth year], 65 y.o.   MRN: [id number]  HPI RASH  Location:lips Onset:12/24/2010 Course: worse this am Self-treated with: She took the antibiotics(1 pill /day) prescribed for her dentist 3/27 &  yesterday . She believes it is amoxicillin.             Improvement with treatment: none  History Pruritis: no  Tenderness: yes New medications/antibiotics: see above  Tick/insect/pet exposure: no  Recent travel: no  New detergent, new clothing, or other topical exposure: yes, she feels that this might be related to pulling weeds on Saturday the 24th. She was wearing gloves but believes she wiped her mouth with the back of the glove. She did not actually visualize any poison ivy or poison oak.  Red Flags Feeling ill: no, but  mild malaise  Fever: yes, but not taken  Mouth lesions: yes,the  oral lesions are described as being white blister- like  type lesions. She has not taken any sulfa drugs recently.  Facial/tongue swelling/difficulty breathing:  no  Diabetic or immunocompromised: She has a past medical history breast cancer; she is on Demadex 1 mg daily (generic)   She's had a chronic cough at night for the last month which she relates to postnasal drainage. She denies any purulent sputum.  She denies frontal headache, facial pain, or tender lymphadenopathy. She's had some pain in the right mandibular dental area. She also has a sore throat. She has had no eye symptoms.  She denies dysuria, hematuria, or pyuria   She did have frank diarrhea last month; this has resolved. Her water sources city [employer])        Review of Systems     Objective:   Physical Exam she is in no acute distress; she looks well-nourished. The most striking finding is  low white patchy lesions on the inside of  the lower lip. There is suggestion of an aphthous ulcer in the right posterior buccal mucosa.   She has no scleral icterus. Extraocular  motion is intact.  There is no evidence of conjunctivitis; vision is normal.  The nares are patent without exudate or erythema.  The otic  canals are clear and tympanic membranes are normal.  She has no lymphadenopathy about the head neck or axilla.  Chest is clear with no rales rhonchi or wheezes.  She has an S4 no significant murmurs or gallops.  Abdomen is nontender with good bowel sounds. There are no organomegaly or masses noted.            Assessment & Plan:  #1 stomatitis; this does not appear to be a contact dermatitis from poison ivy or poison oak. This is based on its location and appearance  #2 low-grade fever   #3 past history of breast cancer; Arimidex chemotherapy.  Plan: a topical antiviral ointment will be applied as directed. CBC and differential will be drawn.

## 2011-01-05 ENCOUNTER — Encounter: Payer: Self-pay | Admitting: Internal Medicine

## 2011-01-05 ENCOUNTER — Ambulatory Visit (INDEPENDENT_AMBULATORY_CARE_PROVIDER_SITE_OTHER): Payer: Federal, State, Local not specified - PPO | Admitting: Internal Medicine

## 2011-01-05 VITALS — BP 112/70 | HR 76 | Temp 98.5°F | Wt 154.6 lb

## 2011-01-05 DIAGNOSIS — R5381 Other malaise: Secondary | ICD-10-CM

## 2011-01-05 DIAGNOSIS — F329 Major depressive disorder, single episode, unspecified: Secondary | ICD-10-CM

## 2011-01-05 DIAGNOSIS — R5383 Other fatigue: Secondary | ICD-10-CM

## 2011-01-05 DIAGNOSIS — F3289 Other specified depressive episodes: Secondary | ICD-10-CM

## 2011-01-05 LAB — HEPATIC FUNCTION PANEL
AST: 18 U/L (ref 0–37)
Albumin: 4.5 g/dL (ref 3.5–5.2)
Alkaline Phosphatase: 86 U/L (ref 39–117)
Bilirubin, Direct: 0.1 mg/dL (ref 0.0–0.3)
Indirect Bilirubin: 0.3 mg/dL (ref 0.0–0.9)
Total Bilirubin: 0.4 mg/dL (ref 0.3–1.2)

## 2011-01-05 LAB — BASIC METABOLIC PANEL
Glucose, Bld: 87 mg/dL (ref 70–99)
Potassium: 4.2 mEq/L (ref 3.5–5.3)
Sodium: 137 mEq/L (ref 135–145)

## 2011-01-05 LAB — T4, FREE: Free T4: 1.1 ng/dL (ref 0.80–1.80)

## 2011-01-05 NOTE — Patient Instructions (Signed)
Please increase the fluoxetine to 40 mg daily pending return of the labs. If the labs are normal and the symptoms persist despite the increase in medicines, I would recommend a possible sleep study. We would need to  rule out sleep apnea. Please talk to your  daughter about what she observed about your sleep pattern

## 2011-01-05 NOTE — Progress Notes (Signed)
  Subjective:    Patient ID: Brooke Carpenter, female    DOB: May 18, 1946, 65 y.o.   MRN: [id number]  HPI FATIGUE  Onset: 6-12 months  Fatigue with exertion: no; even  @ rest  Physical limitations: "I use it as an excuse" Primarily motivational fatigue: ?yes; "(exercise) wasn't worth it. When I exercise , I felt better" Primarily physical fatigue: no  Symptoms Fever: no  Night sweats: no  Weight loss: no   Exertional chest pain: no  Dyspnea: no  Cough: no  Hemoptysis: no  New medications: no  Leg swelling: no  Orthopnea: no  PND: no  Melena: no  Adenopathy: no  Severe snoring: ? as per her daughter; no apnea  as per family & friends  Daytime sleepiness: no  Skin changes: no  Feeling depressed: yes, in spite of Fluoxetine . No suicidal ideation Anhedonia: no  Altered appetite: no  Poor sleep: no . She has been on fluoxetine since the fall of 2011 with significant response. This had been recommended by her counselor.      Review of Systems     Objective:   Physical Exam General appearance is one of good health and nourishment. See current vital signs Eye - Pupils Equal Round Reactive to light, Extraocular movements intact.No lid lag. Conjunctiva without redness or discharge Neck:  No deformities, thyromegaly, masses, or tenderness noted.   Supple with full range of motion without pain. Heart:  Normal rate and regular rhythm. S1 and S2 normal without gallop, murmur, click, rub or other extra sounds.                                                                                                      Lungs:Chest clear to auscultation; no wheezes, rhonchi,rales ,or rubs present.No increased work of breathing. Abdomen: bowel sounds hyperactive, soft and non-tender without masses, organomegaly or hernias noted.  No guarding or rebound. Lymphatic: No lymphadenopathy is noted about the head, neck,or  axilla.  Skin:  Intact without suspicious lesions or rashes Psych:  Cognition and judgment  appear intact. Alert, communicative  and cooperative with normal attention span and concentration.  Neurologic exam :Strength equal & normal in upper & lower extremities; DTRs WNL.Extremities:  No cyanosis, edema. OA hand changes.  Assessment & Plan:  #1 fatigue  #2 depression  Plan: Thyroid function tests will be checked along with chemistries. Pending return of the labs the fluoxetine will be increased to 40 mg daily which would be in a normal maintenance dose.

## 2011-01-05 NOTE — Progress Notes (Signed)
Addended by: Shayleen Eppinger on: 01/05/2011 05:00 PM   Modules accepted: Orders  

## 2011-01-23 ENCOUNTER — Ambulatory Visit
Admission: RE | Admit: 2011-01-23 | Discharge: 2011-01-23 | Disposition: A | Discharge status: Home / Self Care | Payer: Federal, State, Local not specified - PPO | Source: Ambulatory Visit | Attending: Obstetrics and Gynecology | Admitting: Obstetrics and Gynecology

## 2011-01-23 ENCOUNTER — Other Ambulatory Visit: Payer: Self-pay | Admitting: Obstetrics and Gynecology

## 2011-01-23 DIAGNOSIS — Z9889 Other specified postprocedural states: Secondary | ICD-10-CM

## 2011-01-23 DIAGNOSIS — Z9012 Acquired absence of left breast and nipple: Secondary | ICD-10-CM

## 2011-01-29 ENCOUNTER — Other Ambulatory Visit: Payer: Self-pay | Admitting: Internal Medicine

## 2011-01-30 ENCOUNTER — Other Ambulatory Visit: Payer: Self-pay | Admitting: Obstetrics and Gynecology

## 2011-01-30 ENCOUNTER — Other Ambulatory Visit: Payer: Self-pay

## 2011-01-30 NOTE — Telephone Encounter (Signed)
Duplicate request for Prozac, rx sent earlier electronic. I called rx in also to verify med received

## 2011-02-01 ENCOUNTER — Telehealth: Payer: Self-pay | Admitting: Internal Medicine

## 2011-02-01 MED ORDER — FLUOXETINE HCL 40 MG PO CAPS: 40.0000 mg | ORAL_CAPSULE | Freq: Every day | ORAL | Status: DC

## 2011-02-01 NOTE — Telephone Encounter (Signed)
Patient aware rx called in  

## 2011-02-01 NOTE — Telephone Encounter (Signed)
Patient received copy of lab result - note stated to increase  prozac  to 2 a day (40 mg) - patient needs refill- [facility]

## 2011-02-14 NOTE — Op Note (Signed)
NAMEBONNEY, Brooke Carpenter           ACCOUNT NO.:  [id number]   MEDICAL RECORD NO.:  [id number]          PATIENT TYPE:  AMB   LOCATION:  [facility]                          FACILITY:  [facility]   PHYSICIAN:  Sandria Bales. Ezzard Standing, M.D.  DATE OF BIRTH:  [birth year]   DATE OF PROCEDURE:  02/11/2008  DATE OF DISCHARGE:                               OPERATIVE REPORT   Date of surgery  ??   PREOPERATIVE DIAGNOSIS:  A 1.6 cm right breast carcinoma (T1, N0  carcinoma).   POSTOPERATIVE DIAGNOSIS:  A 1.6 cm right breast carcinoma at 12 o'clock  position of the right breast (T1, N0 carcinoma) (pathology pending).   PROCEDURES:  Right breast lumpectomy with needle localization, injection  of methylene blue into right breast, right axillary sentinel lymph node  biopsy.   SURGEON:  Sandria Bales. Ezzard Standing, M.D.   ANESTHESIA:  General endotracheal with a 20 mL of 0.25% Marcaine.   COMPLICATIONS:  None.   INDICATIONS FOR PROCEDURE:  Brooke Carpenter is a 65 year old white female  who is a patient of Dr. Marga Melnick and Dr. Malva Limes.   She had a history of a left breast cancer treated in [place] with a  mastectomy and TRAM in 1993.  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  This  mass is at the 12 o'clock position of the right breast immediately above  her areola.  It measures about 1.6 cm on MRI about 1.2 cm on ultrasound.  She has had a previous reduction mamoplasty on the right breast.   I thought she is a candidate for breast lumpectomy and radiation  therapy, and I discussed with her the options of both lumpectomy and  mastectomy.  The potential complications include bleeding, infection,  nerve injury, and recurrence of cancer.  She has also had a previous  breast reduction on the right side to match her left side, like a thread  in her nipple at midline excision.   OPERATIVE NOTE:  The patient was taken to the operating room.  She had a  right breast infiltrated with 1 mCi of technetium sulfa colloid.  I had  marked the right breast to identify that side.  [facility] localization placed, and the needle  localization came out at the 9 o'clock position to the areola.   A time-out was held identifying the patient.   Right breast and axilla were prepped with Betadine solution and  sterilely draped.  I first injected the right breast with about 2 mL of  40% methylene blue.  I identified a hot spot in her right axilla and  using the Neoprobe went down to the right axilla and identified a  sentinel lymph node area and made an incision over this.  First, I think  got a piece of fat which is actually subcutaneous fat making sure I got  into the axilla.  There was no obvious lymph node  in this specimen.  I  then got to the axilla, found about 2-3 nodes a bunch together and  excised this block of nodes using the NeoProbe.  Identified what was the  hardest node and marked it with a suture.  There really was minimal of  any dye that I could see or any methylene blue that got to these lymph  nodes.   Dr. Dierdre Searles called and said the sentinel lymph node was touch prep negative.   I then turned my attention to the right breast.  The wire came out about  2 cm lateral to the edge of her areola, but the tumor was right under  the areola at the 12 o' clock position.  I used her old scar from her  breast reduction to make a incision through and took out a block of  tissue approximately 5-6 cm in diameter.  I went down to the chest wall.  I oriented the specimen with a long suture of cranial and short suture  medial with the wires position.  The wire comes out lateral and there is  a piece of skin attached to the wire which gives an anterior-posterior  orientation.   A specimen mammogram appeared that the clip was in the  middle of the  specimen.  It looks like the area had been excised.   There was no palpable mass or suspicious area left behind.  She did have  some scarring from her previous reduction mammoplasty.  I irrigated the  wound with saline and closed the subcutaneous tissue with 3-0 Vicryl  suture, and skin with a 5-0 Monocryl suture, I  did the same thing with  the right axilla.  I infiltrated about 20 mL of 0.25% Marcaine into both  the axilla and the breast wound.   We then painted the incision with Dermabond and then wrapped her breast  with an Ace bandage.  She will be discharged home today.  Return to see  me in 1 week for followup pathology.  She is to call for any interval  problem.      Sandria Bales. Ezzard Standing, M.D.  Electronically Signed     DHN/MEDQ  D:  02/11/2008  T:  02/12/2008  Job:  [id number]   cc:   Titus Dubin. Alwyn Ren, MD,FACP,FCCP  Malva Limes, M.D.

## 2011-02-17 NOTE — Assessment & Plan Note (Signed)
[facility]                        [name] OFFICE NOTE   RONISHA, HERRINGSHAW                    MRN:          [id number]  DATE:12/28/2006                            DOB:          January 28, 1946    [name], date of birth June 06, 1946.   Brooke Carpenter was seen December 28, 2006, for clearance for  surveillance colonoscopy.  She had a colonoscopy while living in  [place], probably at age 71.  She was having no GI symptoms.  He has no  GI symptoms at this time.  Specifically, she has no dyspepsia, nausea,  vomiting, melena, rectal bleeding or change in stool caliber.   Her mother has had colitis.   She is on a Zone diet with fluctuations in her weight based on  compliance with diet and with exercise.  She is involved at the [facility]  with spin classes 3 times per week, up to an hour, with no  cardiopulmonary symptoms.   PAST MEDICAL HISTORY:  1. Mastectomy with flap reconstruction.  At the time of extubation      following mastectomy she did have some laryngospasm which resolved      without treatment.  2. She has also had plastic surgery.  3. She has had pregnancies.  No other operations.   She is on Evista and Boniva from Dr. Malva Limes, gynecologist.  SHE  HAS BEEN INTOLERANT TO CALCIUM, BUT IS TRYING TO TAKE TUMS.   SHE HAS NO KNOWN DRUG ALLERGIES.   She is concerned about a sensation of her throat closing off, which she  relates to periods of stress or possibly seasonal triggers.  These may  be more frequent in the Spring.  She does have nasal congestion and  postnasal drainage.   EXAM:  Weight is 153, pulse is 60, respiratory rate is 14, blood  pressure 110/70.  She has full extraocular motion.  Nares are patent.  OROPHARYNX:  Reveals some slight edema of the uvula.  OTIC CANALS:  Clear.  TYMPANIC MEMBRANES:  Normal.  She has no lymphadenopathy about the neck or axilla.  CHEST:  Clear to auscultation w/o  wheezing.  There is a regular rhythm.  All pulses are intact and there is no edema.  There is no organomegaly and there is no tenderness over the abdomen.   There is no clinical contraindication to the colonoscopy.   She may have some reactive airway variant.  Her son does have asthma.   There appears to have been some seasonal exacerbation and I would  recommend that she employ a Neti Pot and use generic fexofenadine as  needed.   She should be on 1500 mg of calcium a day and she should also be on 1000  international units of Vitamin D daily.   If stress is a major trigger for the reactive airways disease then  generic serotonin reuptake inhibitor could be considered.     Titus Dubin. Alwyn Ren, MD,FACP,FCCP  Electronically Signed    [facility]/MedQ  DD: 12/28/2006  DT: 12/28/2006  Job #: [id number]

## 2011-03-22 ENCOUNTER — Encounter (INDEPENDENT_AMBULATORY_CARE_PROVIDER_SITE_OTHER): Payer: Self-pay | Admitting: Surgery

## 2011-07-28 ENCOUNTER — Encounter: Payer: Self-pay | Admitting: *Deleted

## 2011-08-04 ENCOUNTER — Encounter (HOSPITAL_BASED_OUTPATIENT_CLINIC_OR_DEPARTMENT_OTHER): Payer: Federal, State, Local not specified - PPO | Admitting: Oncology

## 2011-08-04 ENCOUNTER — Other Ambulatory Visit: Payer: Self-pay | Admitting: Oncology

## 2011-08-04 DIAGNOSIS — C50919 Malignant neoplasm of unspecified site of unspecified female breast: Secondary | ICD-10-CM

## 2011-08-04 DIAGNOSIS — C50119 Malignant neoplasm of central portion of unspecified female breast: Secondary | ICD-10-CM

## 2011-08-04 LAB — CBC WITH DIFFERENTIAL/PLATELET
Basophils Absolute: 0 10*3/uL (ref 0.0–0.1)
EOS%: 1.8 % (ref 0.0–7.0)
Eosinophils Absolute: 0.1 10*3/uL (ref 0.0–0.5)
HCT: 35.9 % (ref 34.8–46.6)
HGB: 12.3 g/dL (ref 11.6–15.9)
MCH: 31.7 pg (ref 25.1–34.0)
MCV: 92.7 fL (ref 79.5–101.0)
NEUT#: 3.1 10*3/uL (ref 1.5–6.5)
NEUT%: 57.5 % (ref 38.4–76.8)
RDW: 13 % (ref 11.2–14.5)
lymph#: 1.7 10*3/uL (ref 0.9–3.3)

## 2011-08-05 LAB — COMPREHENSIVE METABOLIC PANEL
AST: 22 U/L (ref 0–37)
Albumin: 4.3 g/dL (ref 3.5–5.2)
BUN: 17 mg/dL (ref 6–23)
Calcium: 9.1 mg/dL (ref 8.4–10.5)
Chloride: 102 mEq/L (ref 96–112)
Creatinine, Ser: 0.69 mg/dL (ref 0.50–1.10)
Glucose, Bld: 87 mg/dL (ref 70–99)
Potassium: 3.7 mEq/L (ref 3.5–5.3)

## 2011-08-05 LAB — VITAMIN D 25 HYDROXY (VIT D DEFICIENCY, FRACTURES): Vit D, 25-Hydroxy: 40 ng/mL (ref 30–89)

## 2011-08-07 ENCOUNTER — Ambulatory Visit (HOSPITAL_BASED_OUTPATIENT_CLINIC_OR_DEPARTMENT_OTHER): Payer: Federal, State, Local not specified - PPO | Admitting: Oncology

## 2011-08-07 VITALS — BP 129/76 | HR 89 | Temp 98.6°F | Ht 64.0 in | Wt 145.4 lb

## 2011-08-07 DIAGNOSIS — C50119 Malignant neoplasm of central portion of unspecified female breast: Secondary | ICD-10-CM

## 2011-08-07 DIAGNOSIS — C50919 Malignant neoplasm of unspecified site of unspecified female breast: Secondary | ICD-10-CM

## 2011-08-07 DIAGNOSIS — Z170 Estrogen receptor positive status [ER+]: Secondary | ICD-10-CM

## 2011-08-07 NOTE — Progress Notes (Signed)
ID: Brooke Carpenter  CC:  Chief Complaint  Patient presents with  . Breast Cancer    Interval History: She is doing very well overall, specifically since Dr Alwyn Ren started her on Prozac--she is sleeping much better and having significantl;y less problems w anxiety. She is excited that her son and his wife have adopted two brothers, ages 106 and 41. She is excecising regularly (mostly by walking during lunchtime at work) and has changed her diet to mostly consist of vegetables and fruits, occ meat. She is pleased she has lost a little weight  ROS:  No headaches, nausea or vomiting, cough, phlegm, change in bowel or bladder habits, pain, fever, or bleeding. A detailed review of systems was otherwise unoncontributory.  Medications: I have reviewed the patient's current medications.  Prior to Admission:  Medications Prior to Admission  Medication Sig Dispense Refill  . anastrozole (ARIMIDEX) 1 MG tablet Take 1 mg by mouth daily.        Marland Kitchen FLUoxetine (PROZAC) 40 MG capsule Take 1 capsule (40 mg total) by mouth daily.  90 capsule  2  . Fluticasone-Salmeterol (ADVAIR DISKUS) 100-50 MCG/DOSE AEPB Inhale 1 puff into the lungs every 12 (twelve) hours.        Marland Kitchen HYDROcodone-homatropine (HYCODAN) 5-1.5 MG/5ML syrup Take 5 mLs by mouth every 6 (six) hours as needed.        . ibandronate (BONIVA) 150 MG tablet Take 150 mg by mouth every 30 (thirty) days. Take in the morning with a full glass of water, on an empty stomach, and do not take anything else by mouth or lie down for the next 30 min.        No current facility-administered medications on file as of 08/07/2011.     Objective: Vital signs in last 24 hours: BP 129/76  Pulse 89  Temp(Src) 98.6 F (37 C) (Oral)  Ht 5\' 4"  (1.626 m)  Wt 145 lb 6.4 oz (65.953 kg)  BMI 24.96 kg/m2   Physical Exam:  General appearance: alert and no distress Head: Normocephalic, without obvious abnormality, atraumatic Neck: no adenopathy Lymph nodes:  Cervical, supraclavicular, and axillary nodes normal. Resp: clear to auscultation bilaterally Cardio: regular rate and rhythm, S1, S2 normal, no murmur, click, rub or gallop GI: soft, non-tender; bowel sounds normal; no masses,  no organomegaly Extremities: extremities normal, atraumatic, no cyanosis or edema Neurologic: Grossly normal  .l  Breast exam: L breast no eidence or recurrence post mastectomy; R brieat no evidence of recurrence  Lab Results:   BMET    Component Value Date/Time   NA 137 08/04/2011 1241   NA 137 08/04/2011 1241   NA 137 08/04/2011 1241   NA 137 08/04/2011 1241   K 3.7 08/04/2011 1241   K 3.7 08/04/2011 1241   K 3.7 08/04/2011 1241   K 3.7 08/04/2011 1241   CL 102 08/04/2011 1241   CL 102 08/04/2011 1241   CL 102 08/04/2011 1241   CL 102 08/04/2011 1241   CO2 24 08/04/2011 1241   CO2 24 08/04/2011 1241   CO2 24 08/04/2011 1241   CO2 24 08/04/2011 1241   GLUCOSE 87 08/04/2011 1241   GLUCOSE 87 08/04/2011 1241   GLUCOSE 87 08/04/2011 1241   GLUCOSE 87 08/04/2011 1241   BUN 17 08/04/2011 1241   BUN 17 08/04/2011 1241   BUN 17 08/04/2011 1241   BUN 17 08/04/2011 1241   CREATININE 0.69 08/04/2011 1241   CREATININE 0.69 08/04/2011 1241   CREATININE 0.69  08/04/2011 1241   CREATININE 0.69 08/04/2011 1241   CREATININE 0.69 01/05/2011 1632   CALCIUM 9.1 08/04/2011 1241   CALCIUM 9.1 08/04/2011 1241   CALCIUM 9.1 08/04/2011 1241   CALCIUM 9.1 08/04/2011 1241   GFRNONAA >60 02/28/2010 1730   GFRAA  Value: >60        The eGFR has been calculated using the MDRD equation. This calculation has not been validated in all clinical situations. eGFR's persistently <60 mL/min signify possible Chronic Kidney Disease. 02/28/2010 1730     CMP     Component Value Date/Time   NA 137 08/04/2011 1241   NA 137 08/04/2011 1241   NA 137 08/04/2011 1241   NA 137 08/04/2011 1241   K 3.7 08/04/2011 1241   K 3.7 08/04/2011 1241   K 3.7 08/04/2011 1241   K 3.7 08/04/2011 1241   CL 102 08/04/2011 1241   CL 102  08/04/2011 1241   CL 102 08/04/2011 1241   CL 102 08/04/2011 1241   CO2 24 08/04/2011 1241   CO2 24 08/04/2011 1241   CO2 24 08/04/2011 1241   CO2 24 08/04/2011 1241   GLUCOSE 87 08/04/2011 1241   GLUCOSE 87 08/04/2011 1241   GLUCOSE 87 08/04/2011 1241   GLUCOSE 87 08/04/2011 1241   BUN 17 08/04/2011 1241   BUN 17 08/04/2011 1241   BUN 17 08/04/2011 1241   BUN 17 08/04/2011 1241   CREATININE 0.69 08/04/2011 1241   CREATININE 0.69 08/04/2011 1241   CREATININE 0.69 08/04/2011 1241   CREATININE 0.69 08/04/2011 1241   CREATININE 0.69 01/05/2011 1632   CALCIUM 9.1 08/04/2011 1241   CALCIUM 9.1 08/04/2011 1241   CALCIUM 9.1 08/04/2011 1241   CALCIUM 9.1 08/04/2011 1241   PROT 6.8 08/04/2011 1241   PROT 6.8 08/04/2011 1241   PROT 6.8 08/04/2011 1241   PROT 6.8 08/04/2011 1241   ALBUMIN 4.3 08/04/2011 1241   ALBUMIN 4.3 08/04/2011 1241   ALBUMIN 4.3 08/04/2011 1241   ALBUMIN 4.3 08/04/2011 1241   AST 22 08/04/2011 1241   AST 22 08/04/2011 1241   AST 22 08/04/2011 1241   AST 22 08/04/2011 1241   ALT 21 08/04/2011 1241   ALT 21 08/04/2011 1241   ALT 21 08/04/2011 1241   ALT 21 08/04/2011 1241   ALKPHOS 91 08/04/2011 1241   ALKPHOS 91 08/04/2011 1241   ALKPHOS 91 08/04/2011 1241   ALKPHOS 91 08/04/2011 1241   BILITOT 0.4 08/04/2011 1241   BILITOT 0.4 08/04/2011 1241   BILITOT 0.4 08/04/2011 1241   BILITOT 0.4 08/04/2011 1241   GFRNONAA >60 02/28/2010 1730   GFRAA  Value: >60        The eGFR has been calculated using the MDRD equation. This calculation has not been validated in all clinical situations. eGFR's persistently <60 mL/min signify possible Chronic Kidney Disease. 02/28/2010 1730    CBC    Component Value Date/Time   WBC 7.6 12/29/2010 1246   RBC 3.90 12/29/2010 1246   HGB 12.3 08/04/2011 1241   HGB 12.6 12/29/2010 1246   HCT 35.9 08/04/2011 1241   HCT 36.9 12/29/2010 1246   PLT 174 08/04/2011 1241   PLT 195.0 12/29/2010 1246   MCV 92.7 08/04/2011 1241   MCV 94.6 12/29/2010 1246   MCH 31.9 10/27/2009 1557   MCHC  34.3 12/29/2010 1246   RDW 12.9 12/29/2010 1246   LYMPHSABS 1.5 12/29/2010 1246   MONOABS 0.7 12/29/2010 1246   EOSABS 0.1 08/04/2011 1241  EOSABS 0.1 12/29/2010 1246   BASOSABS 0.0 08/04/2011 1241   BASOSABS 0.0 12/29/2010 1246        Studies/Results: Mammorgram 01/2011 unremarkable   Assessment: 65 y/o [place] woman with 1) Left sided breast cancer, s/p mastectomy and TRAM reconstruction 1993 for a T2 N0 Gradwe 1 invasive ductal carcinoma, estrogen and progesterone receptor positive, treated with cyclophosphamide/doxorubicin/fluorouracil x6 followed by raloxefine for one year  2) s/p Right lumpectomy and sentinel lymph node sampling May 2009 for a T1c N0 Grade 2 invasive ductal carcinoma, strongly estrogen and progesteronme receptor positive, HER-2 negative, with an MIB-1 of 15%, s/p radiation completed August 2009, on anastrozole starting June 2009, continuing on this with good tolerance   Plan: The plan is to continue anastrozole to a total of 5 years; she will follow up w Dr Ezzard Standing in 6 months, with me in 12 months; we are makingno other changes to her plan at this point. She knows to call for any problems that may develop before the next visit.  MAGRINAT,GUSTAV C 08/07/2011

## 2011-08-23 ENCOUNTER — Other Ambulatory Visit: Payer: Self-pay | Admitting: *Deleted

## 2011-08-23 MED ORDER — ANASTROZOLE 1 MG PO TABS: 1.0000 mg | ORAL_TABLET | Freq: Every day | ORAL | Status: DC

## 2011-09-19 ENCOUNTER — Ambulatory Visit (HOSPITAL_BASED_OUTPATIENT_CLINIC_OR_DEPARTMENT_OTHER)
Admission: RE | Admit: 2011-09-19 | Discharge: 2011-09-19 | Disposition: A | Discharge status: Home / Self Care | Payer: Federal, State, Local not specified - PPO | Source: Ambulatory Visit | Attending: Family | Admitting: Family

## 2011-09-19 ENCOUNTER — Encounter: Payer: Self-pay | Admitting: Internal Medicine

## 2011-09-19 ENCOUNTER — Telehealth: Payer: Self-pay | Admitting: Family

## 2011-09-19 ENCOUNTER — Ambulatory Visit (INDEPENDENT_AMBULATORY_CARE_PROVIDER_SITE_OTHER): Payer: Federal, State, Local not specified - PPO | Admitting: Family

## 2011-09-19 ENCOUNTER — Encounter: Payer: Self-pay | Admitting: Family

## 2011-09-19 VITALS — BP 115/76 | HR 98 | Temp 101.4°F | Resp 18 | Wt 145.0 lb

## 2011-09-19 DIAGNOSIS — R071 Chest pain on breathing: Secondary | ICD-10-CM | POA: Insufficient documentation

## 2011-09-19 DIAGNOSIS — R059 Cough, unspecified: Secondary | ICD-10-CM | POA: Insufficient documentation

## 2011-09-19 DIAGNOSIS — R05 Cough: Secondary | ICD-10-CM

## 2011-09-19 DIAGNOSIS — R509 Fever, unspecified: Secondary | ICD-10-CM | POA: Insufficient documentation

## 2011-09-19 DIAGNOSIS — R0989 Other specified symptoms and signs involving the circulatory and respiratory systems: Secondary | ICD-10-CM

## 2011-09-19 DIAGNOSIS — J111 Influenza due to unidentified influenza virus with other respiratory manifestations: Secondary | ICD-10-CM

## 2011-09-19 LAB — POCT INFLUENZA A/B: Influenza A+B Virus Ag-Direct(Rapid): POSITIVE

## 2011-09-19 MED ORDER — OSELTAMIVIR PHOSPHATE 75 MG PO CAPS: 75.0000 mg | ORAL_CAPSULE | Freq: Two times a day (BID) | ORAL | Status: AC

## 2011-09-19 NOTE — Progress Notes (Signed)
Subjective:    Patient ID: Nataleah Scioneaux, female    DOB: August 18, 1946, 65 y.o.   MRN: [id number]  HPI  Ms.  Brockel is a 65 yr old female who presents with chief complaint of cough. Symptoms started on Sunday night.  Cough is productive at times, she has some associated left sided chest discomfort with cough.  She denies fever at home. Temp in office is 101.4.  She reports that she took dayquil at 32 AM. Had flu shot 1 month ago.  Appetite is good.  Denies nausea, vomitting.  + myalgia  Notes some clear nasal congestion and headache.    Review of Systems See HPI  Past Medical History  Diagnosis Date  . Cancer     Breast  . Arthritis     History   Social History  . Marital Status: Divorced    Spouse Name: N/A    Number of Children: N/A  . Years of Education: N/A   Occupational History  . Not on file.   Social History Main Topics  . Smoking status: Never Smoker   . Smokeless tobacco: Not on file  . Alcohol Use: Yes  . Drug Use: No  . Sexually Active: Not on file   Other Topics Concern  . Not on file   Social History Narrative  . No narrative on file    Past Surgical History  Procedure Date  . Mastectomy 1993    Left   . Knee arthroscopy 2009  . Breast lumpectomy 2009    Right, S/P radiation. Dr.Newman  . Colonoscopy 2009    Neg, Due 2019  . Joint replacement 11/15/2009    Knee    Family History  Problem Relation Age of Onset  . Asthma    . Hypertension Mother   . Osteoarthritis Mother   . Coronary artery disease Mother   . Heart disease Mother   . Heart attack Brother 68  . Heart disease Brother     MI    No Known Allergies  Current Outpatient Prescriptions on File Prior to Visit  Medication Sig Dispense Refill  . anastrozole (ARIMIDEX) 1 MG tablet Take 1 tablet (1 mg total) by mouth daily.  30 tablet  12  . FLUoxetine (PROZAC) 40 MG capsule Take 1 capsule (40 mg total) by mouth daily.  90 capsule  2  . ibandronate (BONIVA) 150 MG tablet Take  150 mg by mouth every 30 (thirty) days. Take in the morning with a full glass of water, on an empty stomach, and do not take anything else by mouth or lie down for the next 30 min.         BP 115/76  Pulse 98  Temp(Src) 101.4 F (38.6 C) (Oral)  Resp 18  Wt 145 lb 0.6 oz (65.79 kg)  SpO2 98%       Objective:   Physical Exam  Constitutional: She appears well-developed and well-nourished.  HENT:  Head: Normocephalic and atraumatic.  Right Ear: Tympanic membrane and ear canal normal.  Left Ear: Tympanic membrane and ear canal normal.  Mouth/Throat: No posterior oropharyngeal edema or posterior oropharyngeal erythema.  Neck: Normal range of motion. Neck supple. No thyromegaly present.  Cardiovascular: Normal rate and regular rhythm.   No murmur heard. Pulmonary/Chest: Effort normal and breath sounds normal. No respiratory distress. She has no wheezes. She has no rales. She exhibits no tenderness.  Lymphadenopathy:    She has no cervical adenopathy.  Skin: Skin is warm and dry.  Psychiatric: She has a normal mood and affect. Her behavior is normal. Judgment and thought content normal.          Assessment & Plan:

## 2011-09-19 NOTE — Patient Instructions (Signed)
Please complete your chest x-ray on the first floor today. Start Tamiflu as soon as possible.  Drink plenty of fluids, rest.  You may use tylenol or motrin as needed for fever/pain. Call if symptoms worsen, or if you are not feeling improved in 2-3 days.

## 2011-09-19 NOTE — Telephone Encounter (Signed)
Left message for pt to return our call. When she calls back pls let her know that her chest x-ray is negative for pneumonia.

## 2011-09-19 NOTE — Assessment & Plan Note (Addendum)
Rapid flu test is positive. As she is just within the 48 hr window will initiate tamiflu.  A chest xray was performed due to her fever and pleuritic pain and this is neg for pneumonia. Plan supportive measures.  Pt instructed to call if symptoms worsen or if not feeling better in 2-3 days.

## 2011-09-20 ENCOUNTER — Ambulatory Visit: Payer: Federal, State, Local not specified - PPO | Admitting: Internal Medicine

## 2011-10-22 ENCOUNTER — Other Ambulatory Visit: Payer: Self-pay | Admitting: Internal Medicine

## 2011-12-20 ENCOUNTER — Other Ambulatory Visit: Payer: Self-pay | Admitting: Obstetrics and Gynecology

## 2011-12-20 DIAGNOSIS — Z9889 Other specified postprocedural states: Secondary | ICD-10-CM

## 2011-12-20 DIAGNOSIS — Z853 Personal history of malignant neoplasm of breast: Secondary | ICD-10-CM

## 2012-01-01 ENCOUNTER — Telehealth: Payer: Self-pay | Admitting: *Deleted

## 2012-01-01 MED ORDER — FLUOXETINE HCL 20 MG PO CAPS: 20.0000 mg | ORAL_CAPSULE | Freq: Every day | ORAL | Status: DC

## 2012-01-01 NOTE — Telephone Encounter (Signed)
Discuss with patient, Rx sent. 

## 2012-01-01 NOTE — Telephone Encounter (Signed)
Fluoxetine 20 mg # 30

## 2012-01-01 NOTE — Telephone Encounter (Signed)
Pt left vm stating that she wants to ween off her Prozac 40mg , pt stated she has taken 20mg  in the past and wondered if this could be called in for her so she can eventually come off this medication completely, [phone] or [phone]

## 2012-01-26 ENCOUNTER — Ambulatory Visit
Admission: RE | Admit: 2012-01-26 | Discharge: 2012-01-26 | Disposition: A | Discharge status: Home / Self Care | Payer: Federal, State, Local not specified - PPO | Source: Ambulatory Visit | Attending: Obstetrics and Gynecology | Admitting: Obstetrics and Gynecology

## 2012-01-26 DIAGNOSIS — Z853 Personal history of malignant neoplasm of breast: Secondary | ICD-10-CM

## 2012-01-26 DIAGNOSIS — Z9889 Other specified postprocedural states: Secondary | ICD-10-CM

## 2012-02-25 ENCOUNTER — Encounter (HOSPITAL_BASED_OUTPATIENT_CLINIC_OR_DEPARTMENT_OTHER): Payer: Self-pay | Admitting: *Deleted

## 2012-02-25 ENCOUNTER — Emergency Department (HOSPITAL_BASED_OUTPATIENT_CLINIC_OR_DEPARTMENT_OTHER)
Admission: EM | Admit: 2012-02-25 | Discharge: 2012-02-25 | Disposition: A | Discharge status: Home / Self Care | Payer: Federal, State, Local not specified - PPO | Attending: Emergency Medicine | Admitting: Emergency Medicine

## 2012-02-25 ENCOUNTER — Emergency Department (HOSPITAL_BASED_OUTPATIENT_CLINIC_OR_DEPARTMENT_OTHER): Payer: Federal, State, Local not specified - PPO

## 2012-02-25 DIAGNOSIS — H9209 Otalgia, unspecified ear: Secondary | ICD-10-CM | POA: Insufficient documentation

## 2012-02-25 DIAGNOSIS — Z853 Personal history of malignant neoplasm of breast: Secondary | ICD-10-CM | POA: Insufficient documentation

## 2012-02-25 DIAGNOSIS — R05 Cough: Secondary | ICD-10-CM | POA: Insufficient documentation

## 2012-02-25 DIAGNOSIS — R059 Cough, unspecified: Secondary | ICD-10-CM | POA: Insufficient documentation

## 2012-02-25 DIAGNOSIS — J3489 Other specified disorders of nose and nasal sinuses: Secondary | ICD-10-CM | POA: Insufficient documentation

## 2012-02-25 DIAGNOSIS — J40 Bronchitis, not specified as acute or chronic: Secondary | ICD-10-CM | POA: Insufficient documentation

## 2012-02-25 DIAGNOSIS — R509 Fever, unspecified: Secondary | ICD-10-CM | POA: Insufficient documentation

## 2012-02-25 DIAGNOSIS — M129 Arthropathy, unspecified: Secondary | ICD-10-CM | POA: Insufficient documentation

## 2012-02-25 DIAGNOSIS — Z79899 Other long term (current) drug therapy: Secondary | ICD-10-CM | POA: Insufficient documentation

## 2012-02-25 LAB — BASIC METABOLIC PANEL
CO2: 27 mEq/L (ref 19–32)
Calcium: 9.3 mg/dL (ref 8.4–10.5)
Chloride: 96 mEq/L (ref 96–112)
Creatinine, Ser: 0.5 mg/dL (ref 0.50–1.10)
Glucose, Bld: 99 mg/dL (ref 70–99)
Sodium: 137 mEq/L (ref 135–145)

## 2012-02-25 LAB — CBC
Hemoglobin: 12.9 g/dL (ref 12.0–15.0)
MCH: 31.9 pg (ref 26.0–34.0)
MCV: 91.6 fL (ref 78.0–100.0)
RBC: 4.05 MIL/uL (ref 3.87–5.11)
WBC: 7.1 10*3/uL (ref 4.0–10.5)

## 2012-02-25 MED ORDER — AZITHROMYCIN 250 MG PO TABS: 250.0000 mg | ORAL_TABLET | Freq: Every day | ORAL | Status: DC

## 2012-02-25 MED ORDER — POTASSIUM CHLORIDE CRYS ER 20 MEQ PO TBCR
40.0000 meq | EXTENDED_RELEASE_TABLET | Freq: Once | ORAL | Status: AC
  Administered 2012-02-25: 40 meq via ORAL
  Filled 2012-02-25: qty 2

## 2012-02-25 MED ORDER — IOHEXOL 300 MG/ML  SOLN
80.0000 mL | Freq: Once | INTRAMUSCULAR | Status: AC | PRN
  Administered 2012-02-25: 80 mL via INTRAVENOUS

## 2012-02-25 MED ORDER — ALBUTEROL SULFATE (5 MG/ML) 0.5% IN NEBU
5.0000 mg | INHALATION_SOLUTION | Freq: Once | RESPIRATORY_TRACT | Status: AC
  Administered 2012-02-25: 5 mg via RESPIRATORY_TRACT
  Filled 2012-02-25: qty 1

## 2012-02-25 MED ORDER — ALBUTEROL SULFATE HFA 108 (90 BASE) MCG/ACT IN AERS
2.0000 | INHALATION_SPRAY | RESPIRATORY_TRACT | Status: DC | PRN
  Administered 2012-02-25: 2 via RESPIRATORY_TRACT
  Filled 2012-02-25: qty 6.7

## 2012-02-25 NOTE — ED Notes (Signed)
Cough, congestion since Tues.

## 2012-02-25 NOTE — Discharge Instructions (Signed)
Return to the ED with any concerns including difficulty breathing, vomiting, fainting, decreased level of alertness/lethargy, or any other alarming symptoms  You should use albuterol inhaler 2 puffs every 6 hours, take antibiotics as prescribed and arrange for repeat Chest xray in 3-4 months.

## 2012-02-25 NOTE — ED Provider Notes (Signed)
History   This chart was scribed for Brooke Chick, MD by Charolett Bumpers . The patient was seen in room MH07/MH07.    CSN: [id number]  Arrival date & time 02/25/12  1540   First MD Initiated Contact with Patient 02/25/12 1723      Chief Complaint  Patient presents with  . Cough    (Consider location/radiation/quality/duration/timing/severity/associated sxs/prior treatment) HPI Brooke Carpenter is a 66 y.o. female who presents to the Emergency Department complaining of intermittent, moderate cough with associated rhinorrhea, congestion, chest tightness and low-grade fever for the past 5 days. Patient also notes some left ear pain. Patient states that her symptoms are worsening. Nothing makes the symptoms better or worse. Patient states she does not smoke. Patient reports a h/o breast cancer. Patient states that in 1993 she had a mastectomy and in 2009 a lumpectomy. No other pertinent medical or surgical hx reported.    Past Medical History  Diagnosis Date  . Cancer     Breast  . Arthritis     Past Surgical History  Procedure Date  . Mastectomy 1993    Left   . Knee arthroscopy 2009  . Breast lumpectomy 2009    Right, S/P radiation. Dr.Newman  . Colonoscopy 2009    Neg, Due 2019  . Joint replacement 11/15/2009    Knee    Family History  Problem Relation Age of Onset  . Asthma    . Hypertension Mother   . Osteoarthritis Mother   . Coronary artery disease Mother   . Heart disease Mother   . Heart attack Brother 71  . Heart disease Brother     MI    History  Substance Use Topics  . Smoking status: Never Smoker   . Smokeless tobacco: Not on file  . Alcohol Use: Yes    OB History    Grav Para Term Preterm Abortions TAB SAB Ect Mult Living                  Review of Systems  Constitutional: Positive for fever.  HENT: Positive for ear pain, congestion and rhinorrhea.   Respiratory: Positive for cough and chest tightness.   All other systems  reviewed and are negative.    Allergies  Review of patient's allergies indicates no known allergies.  Home Medications   Current Outpatient Rx  Name Route Sig Dispense Refill  . ANASTROZOLE 1 MG PO TABS Oral Take 1 tablet (1 mg total) by mouth daily. 30 tablet 12  . FLUOXETINE HCL 20 MG PO CAPS Oral Take 1 capsule (20 mg total) by mouth daily. 30 capsule 0  . GUAIFENESIN-DM 100-10 MG/5ML PO SYRP Oral Take 10 mLs by mouth 3 (three) times daily as needed. Patient used this medication for her cold.    . NYQUIL PO Oral Take 10 mLs by mouth daily as needed. Patient used this medication for cold symptoms.    . AZITHROMYCIN 250 MG PO TABS Oral Take 1 tablet (250 mg total) by mouth daily. Take first 2 tablets together, then 1 every day until finished. 6 tablet 0  . IBANDRONATE SODIUM 150 MG PO TABS Oral Take 150 mg by mouth every 30 (thirty) days. Take in the morning with a full glass of water, on an empty stomach, and do not take anything else by mouth or lie down for the next 30 min.       BP 147/68  Pulse 113  Temp(Src) 98.6 F (37 C) (Oral)  Resp 24  Ht 5\' 4"  (1.626 m)  Wt 145 lb (65.772 kg)  BMI 24.89 kg/m2  SpO2 98%  Physical Exam  Nursing note and vitals reviewed. Constitutional: She is oriented to person, place, and time. She appears well-developed and well-nourished. No distress.  HENT:  Head: Normocephalic and atraumatic.  Right Ear: Tympanic membrane and external ear normal.  Left Ear: Tympanic membrane and external ear normal.  Mouth/Throat: Oropharynx is clear and moist.  Eyes: EOM are normal. Pupils are equal, round, and reactive to light.  Neck: Normal range of motion. Neck supple. No tracheal deviation present.  Cardiovascular: Normal rate, regular rhythm and normal heart sounds.   Pulmonary/Chest: Effort normal. No respiratory distress. She has wheezes.       Frequent coughing on exam. Bilaterally mild wheezes.   Abdominal: Soft. Bowel sounds are normal. She  exhibits no distension. There is no tenderness.  Musculoskeletal: Normal range of motion. She exhibits no edema.  Neurological: She is alert and oriented to person, place, and time. No sensory deficit.  Skin: Skin is warm and dry.  Psychiatric: She has a normal mood and affect. Her behavior is normal.    ED Course  Procedures (including critical care time)  DIAGNOSTIC STUDIES: Oxygen Saturation is 98% on room air, normal by my interpretation.    COORDINATION OF CARE:  1800: Discussed planned course of treatment with the patient, who is agreeable at this time. Will order a breathing treatment.  Discussed results of chest x-ray. Will also order a CT with contrast of chest.  1815: Medication Orders: Albuterol (Proventil) (5mg /mL) 0.5% nebulizer solution 5 mg-once.  1915: Medication Orders: Potassium chloride SA (K-Dur, Klor-con) CR tablet 40 mEq-once    Labs Reviewed  BASIC METABOLIC PANEL - Abnormal; Notable for the following:    Potassium 3.0 (*)    All other components within normal limits  CBC   Dg Chest 2 View  02/25/2012  *RADIOLOGY REPORT*  Clinical Data: Cough.  Shortness of breath.  Congestion.  Chest pain.  Recent vomiting.  Breast lumpectomy in 2009.  CHEST - 2 VIEW  Comparison: 12/18 01/2011  Findings: Left axillary clips noted.  Mild dextroconvex thoracic scoliosis is present.  Mild rotary component noted.  Density along the right lower heart border is ascribed to epicardial adipose tissue and accentuation of heart border secondary to pectus excavatum.  An indistinct 9 mm nodularity projecting of the right mid lung is above the expected level of the nipple shadow.  Stable calcific density projects over the upper breast region on the lateral projection.  IMPRESSION:  1.  Possible 9 mm right midlung nodule.  Given the patient's history of breast cancer, chest CT (with contrast if feasible) is recommended for further workup. 2.  Mild dextroconvex thoracic scoliosis with rotary  component.  Original Report Authenticated By: Dellia Cloud, M.D.   Ct Chest W Contrast  02/25/2012  *RADIOLOGY REPORT*  Clinical Data: Suspected nodule on chest radiography.  History breast cancer.  CT CHEST WITH CONTRAST  Technique:  Multidetector CT imaging of the chest was performed following the standard protocol during bolus administration of intravenous contrast.  Contrast: 80mL OMNIPAQUE IOHEXOL 300 MG/ML  SOLN  Comparison: 02/25/2012  Findings: The right hilar lymph node has a short axis diameter of 0.8 cm on image 32 of series 2.  Small right infrahilar nodes are present.  A precarinal node has a short axis diameter of 0.9 cm. Possible TRAM flap noted in the left chest, with chronic  calcific lesions in the reconstructed left breast.  Mild pectus excavatum noted.  There is a large hemangioma in the L2 vertebral body.  Mild biapical pleural parenchymal scarring noted.  The area concerning for pulmonary nodule on today's radiograph represents a confluence of reticulonodular opacity in the right lower lobe as shown on image 38 of series 3.  This is a configuration suggesting atypical infectious bronchiolitis, and is not of a concerning pattern for malignancy.  IMPRESSION:  1.  Observed nodularity on chest radiography corresponds to an area of confluent tree in bud reticulonodular opacity in the right lower lobe favoring atypical infectious bronchiolitis. Although the pattern is reassuring a benign, follow-up chest radiography in 3-4 week's time is suggested to ensure clearance. 2.  Postoperative findings in the left chest, with calcifications in the adipose tissue probably representing foci of fat necrosis. 3.  Small mediastinal lymph nodes are not pathologically enlarged by size criteria. 4.  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  Vertebral hemangiomas are considered benign and incidental, although hemangiomas at this size can reduce biomechanical strength of the vertebra.  Original Report  Authenticated By: Dellia Cloud, M.D.     1. Bronchitis       MDM  Pt presents with c/o cough and congestion that has been ongoing for the past several days.  Pt with hx of breast ca remotely, initial CXR showed possible pulmonary nodule, but Chest CT appearance more c/w infectious process per radiology.  Pt started on zithromax- she was given breathing treatment in ED which helped somewhat with cough- will discharge home also with albuterol MDI.  Pt discharged with strict return precautions.  She is agreeable with this plan.  Advised of need for followup CXR as well.     I personally performed the services described in this documentation, which was scribed in my presence. The recorded information has been reviewed and considered.        Brooke Chick, MD 02/26/12 0000

## 2012-02-28 ENCOUNTER — Telehealth: Payer: Self-pay | Admitting: *Deleted

## 2012-02-28 NOTE — Telephone Encounter (Signed)
Pt seen in ED 

## 2012-02-28 NOTE — Telephone Encounter (Signed)
[facility] Triage Call Report Triage Record Num: [id number] Operator: Tana Felts Patient Name: Brooke Carpenter Call Date & Time: 02/25/2012 3:06:37PM Patient Phone: [phone] PCP: Marga Melnick Patient Gender: Female PCP Fax : [phone] Patient DOB: 11-06-1945 Practice Name: [facility] Reason for Call: Caller: Quintana/Patient; PCP: Marga Melnick; CB#: [phone]; AFebrile; Onset 02/20/12 Call regarding Cough/Chest Congestion; productive cough with clear to yellow sputum. Mild-Moderate headache. Tried Robussin DM and Nyquil. All emergent sxs r/o per Cough Protocol. Care advice given per guidelines. Advised to be seen within 24 hours; with it being a holiday recommeded UC. Pt verbalized understanding. Protocol(s) Used: Cough - Adult Recommended Outcome per Protocol: See Provider within 24 hours Reason for Outcome: Recurrent episodes of uncontrolled coughing interfering with ability to carry out usual activities or with normal sleep patterns Care Advice: Increase fluids to 8-12 eight oz (1.6 to 2.4 liters) glasses per day, half of them to be water. Soups, popsicles, fruit juices, non-caffeinated sodas (unless restricting sodium intake), jello, broths, decaf teas, etc. are all okay. Warm fluids can be soothing. ~ ~ Call provider immediately to report symptoms, if you have not been previously diagnosed for this problem. ~ SYMPTOM / CONDITION MANAGEMENT 02/25/2012 3:17:42PM

## 2012-02-29 ENCOUNTER — Ambulatory Visit (INDEPENDENT_AMBULATORY_CARE_PROVIDER_SITE_OTHER): Payer: Federal, State, Local not specified - PPO | Admitting: Internal Medicine

## 2012-02-29 ENCOUNTER — Encounter: Payer: Self-pay | Admitting: Internal Medicine

## 2012-02-29 VITALS — BP 122/62 | HR 96 | Temp 98.3°F | Wt 144.2 lb

## 2012-02-29 DIAGNOSIS — R918 Other nonspecific abnormal finding of lung field: Secondary | ICD-10-CM

## 2012-02-29 DIAGNOSIS — J45909 Unspecified asthma, uncomplicated: Secondary | ICD-10-CM

## 2012-02-29 DIAGNOSIS — E876 Hypokalemia: Secondary | ICD-10-CM

## 2012-02-29 DIAGNOSIS — R9389 Abnormal findings on diagnostic imaging of other specified body structures: Secondary | ICD-10-CM | POA: Insufficient documentation

## 2012-02-29 LAB — POTASSIUM: Potassium: 3.7 mEq/L (ref 3.5–5.1)

## 2012-02-29 MED ORDER — PREDNISONE 20 MG PO TABS: 20.0000 mg | ORAL_TABLET | Freq: Two times a day (BID) | ORAL | Status: AC

## 2012-02-29 MED ORDER — POTASSIUM CHLORIDE CRYS ER 20 MEQ PO TBCR: 20.0000 meq | EXTENDED_RELEASE_TABLET | Freq: Every day | ORAL | Status: DC

## 2012-02-29 MED ORDER — AZITHROMYCIN 250 MG PO TABS: ORAL_TABLET | ORAL | Status: AC

## 2012-02-29 MED ORDER — FLUTICASONE-SALMETEROL 250-50 MCG/DOSE IN AEPB: 1.0000 | INHALATION_SPRAY | Freq: Two times a day (BID) | RESPIRATORY_TRACT | Status: DC

## 2012-02-29 NOTE — Patient Instructions (Addendum)
Please try to go on My Chart within the next 24 hours to allow me to release the results directly to you.  To increase  Potassium (K+) increase citrus fruits & bananas in diet and use the salt substitute No Salt, which contains  potassium , to season food @ the table. Return in 6 days. Advair one inhalation every 12 hours; gargle and spit after use

## 2012-02-29 NOTE — Progress Notes (Signed)
  Subjective:    Patient ID: Brooke Carpenter, female    DOB: [birth year], 66 y.o.   MRN: [id number]  HPI She noted acute chest heaviness on 02/20/12 after exposure to newly mown grass visiting her daughter in [place]. Symptoms persisted and were associated with some rhinitis. She was seen in urgent care 5/26. Chest x-ray suggests a possible 9 mm right middle lobe nodule; CAT scan proved this to be probable infectious bronchiolitis.  An incidental finding was a potassium of 3.0; she was given oral potassium x1. She is not on a diuretic. She's also not taking an ACE inhibitor  The bronchospastic symptoms were treated with a nebulizer treatment of albuterol. Zithromax was prescribed  Her son has asthma but she denies any prior history of seasonal allergies or reactive airways disease. She's never smoked but was exposed to secondhand smoke from her mother.    Review of Systems At this time the cough persists; is essentially nonproductive. She has some head congestion but denies other symptoms of rhinosinusitis.Specifically she denies nasal obstruction; nasal purulence; facial pain; anosmia;  fever; headache; halitosis; earache and dental pain. She also describes persistent fatigue.    Despite the hypokalemia; she has not had vomiting or diarrhea     Objective:   Physical Exam General appearance:good health ;well nourished; no acute distress or increased work of breathing is present.  No  lymphadenopathy about the head, neck, or axilla noted.   Eyes: No conjunctival inflammation or lid edema is present.   Ears:  External ear exam shows no significant lesions or deformities.  Otoscopic examination reveals clear canals, tympanic membranes are intact bilaterally without bulging, retraction, inflammation or discharge.  Nose:  External nasal examination shows no deformity or inflammation. Nasal mucosa are pink and moist without lesions or exudates. No septal dislocation or deviation.No obstruction to  airflow.   Oral exam: Dental hygiene is good; lips and gums are healthy appearing.There is no oropharyngeal erythema or exudate noted.   Neck:  No deformities, thyromegaly, masses, or tenderness noted.     Heart:  Normal rate and regular rhythm. S1 and S2 normal without gallop, murmur, click, rub or other extra sounds.   Lungs: Harsh nonproductive cough. Diffuse rhonchi and wheezing, especially at the bases.    Extremities:  No cyanosis, edema, or clubbing  noted    Skin: Warm & dry .          Assessment & Plan:  #1 asthmatic bronchitis; initial trigger apparently was extrinsic. CAT scan suggest atypical bronchiolitis  #2 hypokalemia, unexplained  #3 questionable nodule on chest x-ray; this proved to be bronchiolitis as noted in the problem list  Plan: See orders and recommendations

## 2012-03-04 ENCOUNTER — Encounter (INDEPENDENT_AMBULATORY_CARE_PROVIDER_SITE_OTHER): Payer: Self-pay | Admitting: Surgery

## 2012-03-05 ENCOUNTER — Ambulatory Visit (INDEPENDENT_AMBULATORY_CARE_PROVIDER_SITE_OTHER): Payer: Federal, State, Local not specified - PPO | Admitting: Internal Medicine

## 2012-03-05 VITALS — BP 118/70 | HR 90 | Temp 98.4°F | Wt 143.0 lb

## 2012-03-05 DIAGNOSIS — R071 Chest pain on breathing: Secondary | ICD-10-CM

## 2012-03-05 DIAGNOSIS — J40 Bronchitis, not specified as acute or chronic: Secondary | ICD-10-CM

## 2012-03-05 DIAGNOSIS — R0781 Pleurodynia: Secondary | ICD-10-CM

## 2012-03-05 MED ORDER — HYDROCODONE-HOMATROPINE 5-1.5 MG/5ML PO SYRP: 5.0000 mL | ORAL_SOLUTION | Freq: Four times a day (QID) | ORAL | Status: AC | PRN

## 2012-03-05 NOTE — Progress Notes (Signed)
  Subjective:    Patient ID: Brooke Carpenter, female    DOB: 09/24/1946, 66 y.o.   MRN: [id number]  HPI She states she is at least 40% better but she's developed some pleuritic pain on the left with cough in the last 24 hours. She is not having hemoptysis or wheezing. She describes scant sputum production without significant purulence. She has shortness of breath with a paroxysmal cough. Nyquil has not been of benefit in controlling the cough.  She has almost completed the entire course of prednisone and Advair    Review of Systems she has no signs of upper respiratory tract infection. Specifically she denies frontal headache, facial pain, or nasal purulence. She is not having fever , chills or sweats at this time     Objective:   Physical Exam  She is in no acute distress and appears healthy and well-nourished. She is noted to splint in the left inferior axillary area with coughing.  There has been dramatic improvement compared to 02/29/12. She is no rhonchi, rales, wheezes at this time. I do not appreciate a rub in the area of discomfort.  There is tenderness to palpation in that area. Crepitus is not palpable  She has an S4 with no murmurs or gallops.  There is no lymphadenopathy about the head, neck, or axilla.        Assessment & Plan:  #1 asthmatic bronchitis; dramatic improvement in the reactive airways disease component  #2 pleuritic pain on the left; she would be at high risk rib fracture if the cough is not controlled.  Plan: Narcotic cough syrup will be prescribed. She be given a sample of Advair 100/50

## 2012-03-05 NOTE — Patient Instructions (Signed)
Advair 100/50  one inhalation every 12 hours; gargle and spit after use

## 2012-03-07 ENCOUNTER — Ambulatory Visit: Payer: Federal, State, Local not specified - PPO | Admitting: Internal Medicine

## 2012-03-22 ENCOUNTER — Other Ambulatory Visit: Payer: Self-pay | Admitting: Internal Medicine

## 2012-05-17 ENCOUNTER — Telehealth: Payer: Self-pay | Admitting: Oncology

## 2012-05-17 NOTE — Telephone Encounter (Signed)
lmonvm adviising the pt that her appt times have changed on 08/05/2012 from 10:00am to 10:45am due to a change on the md's schedule

## 2012-05-24 ENCOUNTER — Ambulatory Visit (INDEPENDENT_AMBULATORY_CARE_PROVIDER_SITE_OTHER): Payer: Self-pay | Admitting: Surgery

## 2012-05-24 ENCOUNTER — Encounter (INDEPENDENT_AMBULATORY_CARE_PROVIDER_SITE_OTHER): Payer: Self-pay | Admitting: Surgery

## 2012-05-24 VITALS — BP 126/72 | HR 88 | Temp 97.8°F | Resp 16 | Ht 64.0 in | Wt 150.4 lb

## 2012-05-24 DIAGNOSIS — Z853 Personal history of malignant neoplasm of breast: Secondary | ICD-10-CM

## 2012-05-24 NOTE — Progress Notes (Signed)
[facility]  Ovidio Kin, MD,  FACS [address]  [address] [place], [place]    [address] Phone:  [phone] FAX:  [phone]   Re:   Brooke Carpenter DOB:   1945-12-15 MRN:   [id number]  ASSESSMENT AND PLAN: 1.  Left breast cancer  T2, N0 - Mastectomy and TRAM in 1993 in [place].  Has attractive rose tattoo across the top of the breast.  2.  RIght breast cacner  T1, N0 - lumpectomy - 02/11/2008  ER - 99%, PR - 100%, Ki67 - 15%, Her2Neu - neg  Radiation tx.  Disease free.  She is alternating between Dr. Darnelle Catalan and myself.  She will see me back in one year.  3.  On Arimidex - sees Dr. Darnelle Catalan.  No complaints. 4.  Some pain in the left shoulder.  Nonspecific.  HISTORY OF PRESENT ILLNESS: Chief Complaint  Patient presents with  . Annual Exam   Brooke Carpenter is a 66 y.o. (DOB: 17-Sep-1946)  white female who is a patient of Marga Melnick, MD and comes to me today for follow up for right breast cancer and remote left breast cancer.  She is doing well with no concerns or complaints.  Her daughter is at [employer] [facility] [employer] getting a Scientist, water quality in nursing.  She did live in [place].  Her husband is still there.  Her daughter has their two children (2 and 4).  PHYSICAL EXAM: BP 126/72  Pulse 88  Temp 97.8 F (36.6 C)  Resp 16  Ht 5\' 4"  (1.626 m)  Wt 150 lb 6.4 oz (68.221 kg)  BMI 25.82 kg/m2  HEENT:  Pupils equal.  Dentition good. NECK:  Supple.  No thyroid mass. LYMPH NODES:  No cervical, supraclavicular, or axillary adenopathy. BREASTS -  RIGHT:  No palpable mass or nodule.  No nipple discharge.  Scar at 10 o'clock.   LEFT:  TRAM reconstruction.  She has a rose tattoo across the top of the TRAM. UPPER EXTREMITIES:  No evidence of lymphedema.  Despite the discomfort she says she has, she has good ROM of the right shoulder.  DATA REVIEWED: Last mammogram was neg. - 01/26/2012 at [facility].  Ovidio Kin, MD, FACS Office:  [phone]

## 2012-06-19 ENCOUNTER — Ambulatory Visit (INDEPENDENT_AMBULATORY_CARE_PROVIDER_SITE_OTHER): Payer: Federal, State, Local not specified - PPO | Admitting: Internal Medicine

## 2012-06-19 ENCOUNTER — Encounter: Payer: Self-pay | Admitting: Internal Medicine

## 2012-06-19 ENCOUNTER — Other Ambulatory Visit: Payer: Self-pay | Admitting: Internal Medicine

## 2012-06-19 VITALS — BP 118/70 | HR 85 | Temp 98.3°F | Wt 149.2 lb

## 2012-06-19 DIAGNOSIS — M79609 Pain in unspecified limb: Secondary | ICD-10-CM

## 2012-06-19 DIAGNOSIS — M79642 Pain in left hand: Secondary | ICD-10-CM

## 2012-06-19 DIAGNOSIS — R109 Unspecified abdominal pain: Secondary | ICD-10-CM

## 2012-06-19 DIAGNOSIS — G5600 Carpal tunnel syndrome, unspecified upper limb: Secondary | ICD-10-CM

## 2012-06-19 DIAGNOSIS — M1990 Unspecified osteoarthritis, unspecified site: Secondary | ICD-10-CM

## 2012-06-19 DIAGNOSIS — G5603 Carpal tunnel syndrome, bilateral upper limbs: Secondary | ICD-10-CM

## 2012-06-19 DIAGNOSIS — E876 Hypokalemia: Secondary | ICD-10-CM

## 2012-06-19 LAB — POCT URINALYSIS DIPSTICK
Bilirubin, UA: NEGATIVE
Blood, UA: NEGATIVE
Leukocytes, UA: NEGATIVE
Nitrite, UA: NEGATIVE
Urobilinogen, UA: 0.2
pH, UA: 7

## 2012-06-19 MED ORDER — TRAMADOL HCL 50 MG PO TABS: 50.0000 mg | ORAL_TABLET | Freq: Three times a day (TID) | ORAL | Status: DC | PRN

## 2012-06-19 NOTE — Progress Notes (Signed)
  Subjective:    Patient ID: Brooke Carpenter, female    DOB: [birth year], 66 y.o.   MRN: [id number]  HPI Yesterday afternoon she began to have aching pain in the left hand. Initially was in the thumb but spread to involve the entire hand. It lasted for hours and radiated minimally into the forearm. There was no trigger or injury for this. Her concern was that was an atypical presentation of cardiac disease. Elevation and the nonsteroidal agents were of no benefit    Review of Systems Constitutional: no fever, chills, sweats, change in weight  Musculoskeletal:no  muscle cramps or pain; no  joint stiffness, redness, or swelling Skin:no rash, color change Neuro: no weakness; incontinence (stool/urine).Numbness and tingling occurs in both hands while driving at times Heme:no lymphadenopathy; abnormal bruising or bleeding .  She's also had intermittent sharp pain in the right flank which is positional, especially when rotating the thorax. She does have a history of kidney stones. She denies dysuria, pyuria, or hematuria.  She has stopped her potassium; she thought it was not to be renewed. The only potassium on record was 3.0. She is not taking diuretics or laxatives. Review of the  medication list is not suggested iatrogenic etiology of the hypokalemia.        Objective:   Physical Exam Gen.:  well-nourished in appearance. Alert, appropriate and cooperative throughout exam. Head: Normocephalic without obvious abnormalities Eyes: No corneal or conjunctival inflammation noted. No icterus   Neck: No deformities, masses, or tenderness noted. Range of motion & Thyroid normal Lungs: Normal respiratory effort; chest expands symmetrically. Lungs are clear to auscultation without rales, wheezes, or increased work of breathing. Heart: Normal rate and rhythm. Normal S1 and S2. No gallop, click, or rub. S4 with slight  slurring .Abdomen: Bowel sounds normal; abdomen soft and nontender. No masses,  organomegaly or hernias noted.  Musculoskeletal/extremities: No deformity or scoliosis noted of  the thoracic or lumbar spine. No clubbing, cyanosis, edema noted. Range of motion  normal .Tone & strength  normal.DIP osteoarthritic finger changes . Nail health  good.Musculoskeletal/Extremities: Gait and station normal to include heel and toe walking. Able to lie back and sit up without help. Straight leg raising negative to 90.  Neurologic: Alert and oriented x3. Deep tendon reflexes symmetrical and normal. Positive Tinel's sign bilaterally at the wrist         Skin: Intact without suspicious lesions or rashes. Lymph: No cervical, axillary lymphadenopathy present. Psych: Mood and affect are normal. Normally interactive                                                                                         Assessment & Plan:  #1 left hand pain in the context of significant osteoarthritis or degenerative joint disease. Low potassium, calcium, or magnesium could be playing a role  #2 history suggestive of carpal tunnel syndrome  #3 right flank pain most likely related to lumbar degenerative disc disease  #4 hypokalemia, present status unclear  Plan: See orders and recommendations

## 2012-06-19 NOTE — Patient Instructions (Addendum)
Go to Web M.D. for information on Carpal Tunnel Syndrome. If symptoms persist or progress; nerve conduction/EMG studies would be indicated. If these were abnormal, Hand Surgery referral would be indicated.  If you activate My Chart; the results can be released to you as soon as they populate from the lab. If you choose not to use this program; the labs have to be reviewed, copied & mailed   causing a delay in getting the results to you.  

## 2012-06-20 ENCOUNTER — Telehealth: Payer: Self-pay | Admitting: Internal Medicine

## 2012-06-20 LAB — POTASSIUM: Potassium: 3.7 mEq/L (ref 3.5–5.1)

## 2012-06-20 LAB — MAGNESIUM: Magnesium: 2.2 mg/dL (ref 1.5–2.5)

## 2012-06-20 LAB — TSH: TSH: 1.86 u[IU]/mL (ref 0.35–5.50)

## 2012-06-20 NOTE — Telephone Encounter (Signed)
Patient aware of labs results 

## 2012-06-20 NOTE — Telephone Encounter (Signed)
LM ON TRIAGE 414PM needs lab results, her my chart has timed out  Pt did not leave a call back number

## 2012-08-05 ENCOUNTER — Ambulatory Visit: Payer: Federal, State, Local not specified - PPO | Admitting: Physician Assistant

## 2012-08-05 ENCOUNTER — Other Ambulatory Visit: Payer: Federal, State, Local not specified - PPO | Admitting: Lab

## 2012-08-13 ENCOUNTER — Other Ambulatory Visit: Payer: Self-pay | Admitting: Physician Assistant

## 2012-08-13 DIAGNOSIS — M899 Disorder of bone, unspecified: Secondary | ICD-10-CM

## 2012-08-13 DIAGNOSIS — Z853 Personal history of malignant neoplasm of breast: Secondary | ICD-10-CM

## 2012-08-14 ENCOUNTER — Encounter: Payer: Self-pay | Admitting: Physician Assistant

## 2012-08-14 ENCOUNTER — Telehealth: Payer: Self-pay | Admitting: Oncology

## 2012-08-14 ENCOUNTER — Ambulatory Visit (HOSPITAL_BASED_OUTPATIENT_CLINIC_OR_DEPARTMENT_OTHER): Payer: Federal, State, Local not specified - PPO | Admitting: Physician Assistant

## 2012-08-14 ENCOUNTER — Other Ambulatory Visit (HOSPITAL_BASED_OUTPATIENT_CLINIC_OR_DEPARTMENT_OTHER): Payer: Federal, State, Local not specified - PPO | Admitting: Lab

## 2012-08-14 VITALS — BP 125/73 | HR 90 | Temp 98.2°F | Resp 20 | Ht 64.0 in | Wt 149.8 lb

## 2012-08-14 DIAGNOSIS — M8580 Other specified disorders of bone density and structure, unspecified site: Secondary | ICD-10-CM

## 2012-08-14 DIAGNOSIS — Z853 Personal history of malignant neoplasm of breast: Secondary | ICD-10-CM

## 2012-08-14 DIAGNOSIS — Z170 Estrogen receptor positive status [ER+]: Secondary | ICD-10-CM

## 2012-08-14 DIAGNOSIS — C50919 Malignant neoplasm of unspecified site of unspecified female breast: Secondary | ICD-10-CM

## 2012-08-14 DIAGNOSIS — M949 Disorder of cartilage, unspecified: Secondary | ICD-10-CM

## 2012-08-14 DIAGNOSIS — M545 Low back pain: Secondary | ICD-10-CM | POA: Insufficient documentation

## 2012-08-14 DIAGNOSIS — R439 Unspecified disturbances of smell and taste: Secondary | ICD-10-CM

## 2012-08-14 DIAGNOSIS — M899 Disorder of bone, unspecified: Secondary | ICD-10-CM

## 2012-08-14 LAB — CBC WITH DIFFERENTIAL/PLATELET
BASO%: 0.4 % (ref 0.0–2.0)
EOS%: 2.4 % (ref 0.0–7.0)
LYMPH%: 33.1 % (ref 14.0–49.7)
MCH: 31.9 pg (ref 25.1–34.0)
MCHC: 34.3 g/dL (ref 31.5–36.0)
MCV: 93.1 fL (ref 79.5–101.0)
MONO%: 8.3 % (ref 0.0–14.0)
NEUT#: 2.9 10*3/uL (ref 1.5–6.5)
Platelets: 190 10*3/uL (ref 145–400)
RBC: 4.01 10*6/uL (ref 3.70–5.45)
RDW: 12.8 % (ref 11.2–14.5)

## 2012-08-14 LAB — COMPREHENSIVE METABOLIC PANEL (CC13)
AST: 19 U/L (ref 5–34)
Albumin: 4.1 g/dL (ref 3.5–5.0)
Alkaline Phosphatase: 104 U/L (ref 40–150)
Glucose: 86 mg/dl (ref 70–99)
Potassium: 3.7 mEq/L (ref 3.5–5.1)
Sodium: 137 mEq/L (ref 136–145)
Total Bilirubin: 0.68 mg/dL (ref 0.20–1.20)
Total Protein: 7.2 g/dL (ref 6.4–8.3)

## 2012-08-14 MED ORDER — ANASTROZOLE 1 MG PO TABS: 1.0000 mg | ORAL_TABLET | Freq: Every day | ORAL | Status: DC

## 2012-08-14 NOTE — Patient Instructions (Signed)
No changes in treatment - continue anastrazole, 1 mg daily.  Refill has been sent to [facility].  MRI of lower back to evaluate pain.  Possibly a referral to neuro depending on results.  Bone Density, next available at [facility]  Referral to Dr. Lazarus Salines or one of his associates (ENT) to evaluate disturbance in smell/taste.  Mammogram due in April, 2014.  Return in 1 year for lab and exam -- will be completing anastrazole!

## 2012-08-14 NOTE — Telephone Encounter (Signed)
lmonvm adviisng the pt of her mammo/[facility] appt [facility] along with the appt with the ent on 08/15/2012@1 :30pm. Pt will be seeing dr Pollyann Kennedy.. Pt has her nov 2014 appt calendar

## 2012-08-14 NOTE — Progress Notes (Signed)
ID: [name]   DOB: September 15, 1946  MR#: [id number]  CSN#:[id number]  PCP: Marga Melnick, MD GYN:  SU:  OTHER MD:   HISTORY OF PRESENT ILLNESS: Tamora's breast cancer history actually goes up back to 1993, when she had a left mastectomy in [place], [place] for what to the best of my ability to tell from the available records was a 2-3 cm breast cancer, lymph node negative and most likely estrogen receptor negative since she was not treated with tamoxifen.  She did receive CAF chemotherapy times six and approximately a year after her mastectomy was started on Evista.  She also had TRAM reconstruction.  She had a screening mammogram January 15, 2008, which showed a possible mass in the right breast.  Spot compression views showed an irregular mass in the right subareolar area with no associated microcalcifications.  There was very minimal thickening palpable in that area and by ultrasound it was irregular and measured 1.2 cm.  The axilla was negative by ultrasound and a biopsy of this area was performed January 27, 2008, with the pathology there ([id number] and [id number]) showing an invasive ductal carcinoma, intermediate grade, 99% ER positive, 100% PR positive with a low proliferation marker at 15%, HER2/neu equivocal at 2+, but FISH negative with a ratio of 1.6.  With this information the patient was referred to Dr. Ovidio Kin and bilateral breast MRIs were obtained Feb 05, 2008.  This confirmed the presence of a 1.6 cm invasive ductal carcinoma in the anterior right breast at the 12 o'clock position.  There were no other masses or areas suspicious for malignancy on either side and no abnormal appearing lymph nodes were seen.  With this information the patient proceeded to right lumpectomy and sentinel lymph node biopsy on Feb 11, 2008.  The final pathology ([id number]) showed a grade 2 invasive ductal carcinoma measuring 1.5 cm, with negative and ample margins, no evidence of lymphovascular invasion  and with 0 of 1 sentinel lymph node involved.  She subsequently received radiation therapy, and was started on anastrozole in June 2009.  INTERVAL HISTORY: Amariah returns today for a routine one year follow up for her breast cancer.  She continues on anastrzole which she is tolerating very well with no increased hot flashes, increased joint pain, or vaginal dryness. She continues on Boniva for her history of osteopenia and tolerates that medication well.  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 She recently returned from [place] where she babysat for her to adopted grandsons, now ages 41 and 11.  REVIEW OF SYSTEMS: Haasini  has a history of some lower back pain, but notes that this has increased significantly over the last few months, often radiating around her right hip. She's also noticed that she cannot smell or taste most things. She denies any sinus congestion. She has never been a smoker. She denies any abnormal headaches or dizziness. She's had no decrease hearing. She denies any change in vision.  Otherwise, Kinda has had no fevers, chills, or night sweats. She's had no rashes or abnormal bleeding. She's eating and drinking well no nausea or change in bowel or bladder habits. She denies any significant cough or phlegm production. She has mild shortness of breath only with exertion, but denies any chest pain or palpitations. No peripheral swelling.  A detailed review of systems is otherwise noncontributory.   PAST MEDICAL HISTORY: Past Medical History  Diagnosis Date  . Cancer  Breast  . Arthritis   Significant for osteopenia, bone density February of 2007 showed osteoporosis, but a repeat April of 2009 after two years of Sandrea Hammond shows improvement to the osteopenia level.    PAST SURGICAL HISTORY: Past Surgical History  Procedure Date  . Mastectomy 1993    Left   . Knee arthroscopy 2009  . Breast lumpectomy  2009    Right, S/P radiation. Dr.Newman  . Colonoscopy 2009    Neg, Due 2019  . Joint replacement 11/15/2009    Knee    FAMILY HISTORY Family History  Problem Relation Age of Onset  . Asthma    . Hypertension Mother   . Osteoarthritis Mother   . Coronary artery disease Mother   . Heart disease Mother   . Heart attack Brother 57  . Heart disease Brother     MI  . Asthma Son     GYNECOLOGIC HISTORY: She is Gx, P2, first pregnancy age 77.  She did nurse.  Her menstrual periods stopped with chemotherapy in 1993.  She originally had significant problems with hot flashes, but those have now resolved.  SOCIAL HISTORY:  She works for the [employer] in Present[employer], broadcasting.  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  This is the reason the patient moved here from [place], [place].  She is divorced and lives by herself.  She attends [employer].  Her son Roger Shelter lives in [place] and works as an Airline pilot.  He has two adopted sons.  Her daughter Parish Dubose lives in [place] near [place].  The patient has a granddaughter in [place] whom she really enjoys visiting.   ADVANCED DIRECTIVES:  HEALTH MAINTENANCE: History  Substance Use Topics  . Smoking status: Never Smoker   . Smokeless tobacco: Not on file  . Alcohol Use: No     Comment:      No Known Allergies  Current Outpatient Prescriptions  Medication Sig Dispense Refill  . anastrozole (ARIMIDEX) 1 MG tablet Take 1 tablet (1 mg total) by mouth daily.  90 tablet  3  . FLUoxetine (PROZAC) 20 MG capsule TAKE ONE CAPSULE BY MOUTH ONE TIME DAILY  30 capsule  5  . ibandronate (BONIVA) 150 MG tablet Take 150 mg by mouth every 30 (thirty) days. Take in the morning with a full glass of water, on an empty stomach, and do not take anything else by mouth or lie down for the next 30 min.       . [DISCONTINUED] anastrozole (ARIMIDEX) 1 MG tablet  Take 1 tablet (1 mg total) by mouth daily.  30 tablet  12  . traMADol (ULTRAM) 50 MG tablet Take 1 tablet (50 mg total) by mouth every 8 (eight) hours as needed for pain.  30 tablet  0    OBJECTIVE:  Middle-aged white female who appears comfortable and in no acute distress Filed Vitals:   08/14/12 1135  BP: 125/73  Pulse: 90  Temp: 98.2 F (36.8 C)  Resp: 20     Body mass index is 25.71 kg/(m^2).    ECOG FS: 0 Filed Weights   08/14/12 1135  Weight: 149 lb 12.8 oz (67.949 kg)   Sclerae unicteric Oropharynx clear No cervical or supraclavicular adenopathy Lungs no rales or rhonchi Heart regular rate and rhythm Abd soft, nontender, positive bowel sounds MSK no focal spinal tenderness  No peripheral edema Neuro: nonfocal, alert and oriented x3 Breasts:  right breast is status post lumpectomy with no evidence of local recurrence. Left breast is status post mastectomy with TRAM reconstruction with no evidence of local recurrence. Axillae are benign bilaterally, no adenopathy palpated.   LAB RESULTS: Lab Results  Component Value Date   WBC 5.2 08/14/2012   NEUTROABS 2.9 08/14/2012   HGB 12.8 08/14/2012   HCT 37.4 08/14/2012   MCV 93.1 08/14/2012   PLT 190 08/14/2012   CMET, CA27.29, and Vitamin D labs are pending.    STUDIES:  01/26/2012 RADIOLOGY REPORT*  Clinical Data: Right lumpectomy 2009. The left mastectomy 1992.  DIGITAL DIAGNOSTIC BILATERAL MAMMOGRAM WITH CAD  Comparison: Multiple priors  Findings: There is a fibroglandular pattern. The patient has had  a right lumpectomy and left mastectomy with TRAM reconstruction.  No mass or malignant type calcifications are seen. Compared to  priors.  Mammographic images were processed with CAD.  IMPRESSION:  No mammographic evidence of malignancy. Recommend diagnostic  mammography in 1 year.  BI-RADS CATEGORY 2: Benign finding(s).  Original Report Authenticated By: Hiram Gash, M.D.   Most recent bone density  exam was in April 2011 showing osteopenia.    ASSESSMENT: 66 y.o.  Pura Spice woman with  1) Left sided breast cancer, s/p mastectomy and TRAM reconstruction 1993 for a T2 N0 Grade 1 invasive ductal carcinoma, estrogen and progesterone receptor positive, treated with cyclophosphamide/doxorubicin/fluorouracil x6 followed by raloxefine for one year  2) s/p Right lumpectomy and sentinel lymph node sampling May 2009 for a T1c N0 Grade 2 invasive ductal carcinoma, strongly estrogen and progesteronme receptor positive, HER-2 negative, with an MIB-1 of 15%, s/p radiation completed August 2009, on anastrozole starting June 2009, continuing on this with good tolerance.  Plan is to complete a total of 5 years.   PLAN: With regards to her breast cancer, Chandrika is doing quite well, and will continue on anastrozole for one more year. She is due for her next bone density exam which will be ordered now. Her next annual mammogram will be in April 2014, and she'll return to see [place] in one year.  I'm referring her for an MRI of the lumbar sacral spine to evaluate her increased lower back pain. Depending on those results, we may consider referring her to a neurosurgeon. I am also referring her to see Dr. Lazarus Salines or one of his associates for further evaluation of her disturbance in smell/taste.    All this was reviewed in detail with the patient today. She was given these instructions in writing, and voices her understanding and agreement. She knows to call with any changes or problems.    Asaf Elmquist    08/14/2012

## 2012-08-15 ENCOUNTER — Ambulatory Visit (HOSPITAL_COMMUNITY)
Admission: RE | Admit: 2012-08-15 | Discharge: 2012-08-15 | Disposition: A | Discharge status: Home / Self Care | Payer: Federal, State, Local not specified - PPO | Source: Ambulatory Visit | Attending: Physician Assistant | Admitting: Physician Assistant

## 2012-08-15 DIAGNOSIS — M47817 Spondylosis without myelopathy or radiculopathy, lumbosacral region: Secondary | ICD-10-CM | POA: Insufficient documentation

## 2012-08-15 DIAGNOSIS — M545 Low back pain, unspecified: Secondary | ICD-10-CM | POA: Insufficient documentation

## 2012-08-15 DIAGNOSIS — M5137 Other intervertebral disc degeneration, lumbosacral region: Secondary | ICD-10-CM | POA: Insufficient documentation

## 2012-08-15 DIAGNOSIS — Q762 Congenital spondylolisthesis: Secondary | ICD-10-CM | POA: Insufficient documentation

## 2012-08-15 DIAGNOSIS — M5126 Other intervertebral disc displacement, lumbar region: Secondary | ICD-10-CM | POA: Insufficient documentation

## 2012-08-15 DIAGNOSIS — M51379 Other intervertebral disc degeneration, lumbosacral region without mention of lumbar back pain or lower extremity pain: Secondary | ICD-10-CM | POA: Insufficient documentation

## 2012-08-15 MED ORDER — GADOBENATE DIMEGLUMINE 529 MG/ML IV SOLN
14.0000 mL | Freq: Once | INTRAVENOUS | Status: AC | PRN
  Administered 2012-08-15: 14 mL via INTRAVENOUS

## 2012-08-16 ENCOUNTER — Other Ambulatory Visit: Payer: Self-pay | Admitting: Physician Assistant

## 2012-08-16 ENCOUNTER — Telehealth: Payer: Self-pay | Admitting: *Deleted

## 2012-08-16 DIAGNOSIS — Z853 Personal history of malignant neoplasm of breast: Secondary | ICD-10-CM

## 2012-08-16 DIAGNOSIS — M545 Low back pain: Secondary | ICD-10-CM

## 2012-08-16 NOTE — Telephone Encounter (Signed)
FAXED OVER TO THE REFERRING MD OFFICE AT [phone] GAVE MEDICAL RECORDS [facility] COVER AND DEMOGRAPHICS PRINTED OUT ON 08-16-2012

## 2012-08-19 ENCOUNTER — Ambulatory Visit (HOSPITAL_COMMUNITY): Admission: RE | Admit: 2012-08-19 | Payer: Federal, State, Local not specified - PPO | Source: Ambulatory Visit

## 2012-08-19 ENCOUNTER — Telehealth: Payer: Self-pay | Admitting: Oncology

## 2012-08-19 NOTE — Telephone Encounter (Signed)
Faxed medical records to Dr. Verlee Rossetti office. Per Dr. Darnelle Catalan

## 2012-08-23 ENCOUNTER — Ambulatory Visit
Admission: RE | Admit: 2012-08-23 | Discharge: 2012-08-23 | Disposition: A | Discharge status: Home / Self Care | Payer: Federal, State, Local not specified - PPO | Source: Ambulatory Visit | Attending: Physician Assistant | Admitting: Physician Assistant

## 2012-08-23 DIAGNOSIS — Z853 Personal history of malignant neoplasm of breast: Secondary | ICD-10-CM

## 2012-08-23 DIAGNOSIS — M8580 Other specified disorders of bone density and structure, unspecified site: Secondary | ICD-10-CM

## 2012-09-11 ENCOUNTER — Other Ambulatory Visit: Payer: Self-pay | Admitting: Oncology

## 2012-09-11 DIAGNOSIS — Z853 Personal history of malignant neoplasm of breast: Secondary | ICD-10-CM

## 2012-09-12 ENCOUNTER — Other Ambulatory Visit: Payer: Self-pay | Admitting: Internal Medicine

## 2012-10-23 ENCOUNTER — Encounter: Payer: Self-pay | Admitting: Internal Medicine

## 2012-10-23 ENCOUNTER — Ambulatory Visit (INDEPENDENT_AMBULATORY_CARE_PROVIDER_SITE_OTHER): Payer: Federal, State, Local not specified - PPO | Admitting: Internal Medicine

## 2012-10-23 VITALS — BP 110/64 | HR 81 | Temp 98.1°F | Wt 149.0 lb

## 2012-10-23 DIAGNOSIS — R05 Cough: Secondary | ICD-10-CM

## 2012-10-23 DIAGNOSIS — J310 Chronic rhinitis: Secondary | ICD-10-CM

## 2012-10-23 MED ORDER — MOMETASONE FUROATE 50 MCG/ACT NA SUSP: NASAL | Status: DC

## 2012-10-23 NOTE — Patient Instructions (Addendum)
Plain Mucinex (NOT D) for thick secretions ;force NON dairy fluids .   Nasal cleansing in the shower as discussed with lather of mild shampoo.After 10 seconds wash off lather while  exhaling through nostrils. Make sure that all residual soap is removed to prevent irritation.  Nasonex 1 spray in each nostril twice a day as needed. Use the "crossover" technique into opposite nostril spraying toward opposite ear @ 45 degree angle, not straight up into nostril.  Use a Neti pot daily only  as needed for significant sinus congestion; going from open side to congested side . Plain Allegra (NOT D )  160 daily , Loratidine 10 mg , OR Zyrtec 10 mg @ bedtime  as needed for itchy eyes & sneezing.    

## 2012-10-23 NOTE — Progress Notes (Signed)
  Subjective:    Patient ID: Brooke Carpenter, female    DOB: [birth year], 67 y.o.   MRN: [id number]  HPI The respiratory tract symptoms began 10/21/12 as  Rhinitis. As of 1/21 cough with non visualized sputum.     Flu shot  current          Treatment with  Nyquil was partially effective   There is no history of asthma; she had RAD post chemical exposure. The patient had never smoked                 Review of Systems Symptoms not present include frontal headache, facial pain, dental pain, sore throat, nasal purulence, earache , and otic discharge  Cough is not associated with  shortness of breath and wheezing .  Fever,chills & sweats not present   No new myalgias and arthralgias  Present  Itchy , watery eyes & sneezing were not noted.    Objective:   Physical Exam General appearance:good health ;well nourished; no acute distress or increased work of breathing is present.  No  lymphadenopathy about the head, neck, or axilla noted.  Eyes: No conjunctival inflammation or lid edema is present.  Ears:  External ear exam shows no significant lesions or deformities.  Otoscopic examination reveals clear canals, tympanic membranes are intact bilaterally without bulging, retraction, inflammation or discharge. Nose:  External nasal examination shows no deformity or inflammation. Nasal mucosa are pink and moist without lesions or exudates. No septal dislocation or deviation.No obstruction to airflow.  Oral exam: Dental hygiene is good; lips and gums are healthy appearing.There is no oropharyngeal erythema or exudate noted.  Neck:  No deformities,  masses, or tenderness noted.   Heart:  Normal rate and regular rhythm. S1 and S2 normal without gallop, murmur, click, rub .S4 with slurring .  Lungs:Chest clear to auscultation; no wheezes, rhonchi,rales ,or rubs present.No increased work of breathing.   Extremities:  No cyanosis, edema, or clubbing  noted . DJD of hands Skin: Warm  & dry .           Assessment & Plan:  #1 rhinitis without criteria for rhinosinusitis  #2 cough, most likely related to #1  Plan: See orders & recommendations

## 2012-10-29 ENCOUNTER — Ambulatory Visit (INDEPENDENT_AMBULATORY_CARE_PROVIDER_SITE_OTHER): Payer: Federal, State, Local not specified - PPO | Admitting: Internal Medicine

## 2012-10-29 ENCOUNTER — Encounter: Payer: Self-pay | Admitting: Internal Medicine

## 2012-10-29 VITALS — BP 124/68 | HR 107 | Temp 98.5°F | Wt 148.0 lb

## 2012-10-29 DIAGNOSIS — R05 Cough: Secondary | ICD-10-CM

## 2012-10-29 DIAGNOSIS — J209 Acute bronchitis, unspecified: Secondary | ICD-10-CM

## 2012-10-29 MED ORDER — FLUTICASONE-SALMETEROL 250-50 MCG/DOSE IN AEPB: 1.0000 | INHALATION_SPRAY | Freq: Two times a day (BID) | RESPIRATORY_TRACT | Status: DC

## 2012-10-29 MED ORDER — HYDROCODONE-HOMATROPINE 5-1.5 MG/5ML PO SYRP: 5.0000 mL | ORAL_SOLUTION | Freq: Four times a day (QID) | ORAL | Status: DC | PRN

## 2012-10-29 MED ORDER — AZITHROMYCIN 250 MG PO TABS: ORAL_TABLET | ORAL | Status: DC

## 2012-10-29 NOTE — Progress Notes (Signed)
  Subjective:    Patient ID: Brooke Carpenter, female    DOB: 1945/10/20, 67 y.o.   MRN: [id number]  HPI  COUGH: Onset:10/21/12 Character: essentially dry; scant  "gooey" material Trigger:none specifically Course:worse1/25/14 after singing Treatment/efficacy:Tylenol, Dayquil, Nasonex, Rx cough syrup partially effective  Similar symptoms described in grandson and his entire family recently.    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 Isolated wheezing today only GI symptoms: No dyspepsia, dysphagia, reflux symptoms     Objective:   Physical Exam General appearance:good health ;well nourished; no acute distress or increased work of breathing is present.  No  lymphadenopathy about the head, neck, or axilla noted.   Eyes: No conjunctival inflammation or lid edema is present.   Ears:  External ear exam shows no significant lesions or deformities.  Otoscopic examination reveals clear canals, tympanic membranes are intact bilaterally without bulging, retraction, inflammation or discharge.  Nose:  External nasal examination shows no deformity or inflammation. Nasal mucosa are pink and moist without lesions or exudates. No septal dislocation or deviation.No obstruction to airflow.   Oral exam: Dental hygiene is good; lips and gums are healthy appearing.There is no oropharyngeal erythema or exudate noted.   Neck:  No deformities,  masses, or tenderness noted.      Heart:  Normal rate and  regular rhythm. S1 and S2 normal without gallop, murmur, click, rub or other extra sounds.   Lungs:Chest clear to auscultation; no wheezes, rhonchi,rales ,or rubs present.No increased work of breathing.  Dry cough   Extremities:  No cyanosis, edema, or clubbing  noted    Skin: Warm & dry           Assessment & Plan:  #1 atypical bronchitis , R/O RAD Plan: See orders and recommendations

## 2012-10-29 NOTE — Patient Instructions (Addendum)
Stay well hydrated. Drink to thirst up to 40 ounces of fluids daily. Plain Mucinex for thick secretions    Advair one inhalation every 12 hours; gargle and spit after use

## 2012-11-06 ENCOUNTER — Encounter: Payer: Self-pay | Admitting: Internal Medicine

## 2012-11-06 ENCOUNTER — Ambulatory Visit (INDEPENDENT_AMBULATORY_CARE_PROVIDER_SITE_OTHER): Payer: Federal, State, Local not specified - PPO | Admitting: Internal Medicine

## 2012-11-06 ENCOUNTER — Ambulatory Visit (HOSPITAL_BASED_OUTPATIENT_CLINIC_OR_DEPARTMENT_OTHER)
Admission: RE | Admit: 2012-11-06 | Discharge: 2012-11-06 | Disposition: A | Discharge status: Home / Self Care | Payer: Federal, State, Local not specified - PPO | Source: Ambulatory Visit | Attending: Internal Medicine | Admitting: Internal Medicine

## 2012-11-06 VITALS — BP 126/74 | HR 111 | Temp 98.4°F | Wt 149.0 lb

## 2012-11-06 DIAGNOSIS — R0781 Pleurodynia: Secondary | ICD-10-CM

## 2012-11-06 DIAGNOSIS — J45909 Unspecified asthma, uncomplicated: Secondary | ICD-10-CM | POA: Insufficient documentation

## 2012-11-06 DIAGNOSIS — R05 Cough: Secondary | ICD-10-CM | POA: Insufficient documentation

## 2012-11-06 DIAGNOSIS — R059 Cough, unspecified: Secondary | ICD-10-CM | POA: Insufficient documentation

## 2012-11-06 DIAGNOSIS — R071 Chest pain on breathing: Secondary | ICD-10-CM

## 2012-11-06 DIAGNOSIS — J3489 Other specified disorders of nose and nasal sinuses: Secondary | ICD-10-CM | POA: Insufficient documentation

## 2012-11-06 LAB — CBC WITH DIFFERENTIAL/PLATELET
Basophils Absolute: 0 10*3/uL (ref 0.0–0.1)
Eosinophils Absolute: 0.1 10*3/uL (ref 0.0–0.7)
Eosinophils Relative: 1.8 % (ref 0.0–5.0)
MCV: 91.7 fl (ref 78.0–100.0)
Monocytes Absolute: 0.7 10*3/uL (ref 0.1–1.0)
Neutrophils Relative %: 55.9 % (ref 43.0–77.0)
Platelets: 202 10*3/uL (ref 150.0–400.0)
WBC: 6.8 10*3/uL (ref 4.5–10.5)

## 2012-11-06 MED ORDER — PREDNISONE 20 MG PO TABS: 20.0000 mg | ORAL_TABLET | Freq: Two times a day (BID) | ORAL | Status: DC

## 2012-11-06 MED ORDER — BUDESONIDE-FORMOTEROL FUMARATE 160-4.5 MCG/ACT IN AERO: 2.0000 | INHALATION_SPRAY | Freq: Two times a day (BID) | RESPIRATORY_TRACT | Status: DC

## 2012-11-06 NOTE — Progress Notes (Signed)
  Subjective:    Patient ID: Brooke Carpenter, female    DOB: 08-31-1946, 67 y.o.   MRN: [id number]  HPI The respiratory tract symptoms persist as paroxysmal cough  with airway compromise. She is unable to produce any significant  sputum; cough has resulted in some right pleuritic pain. She does believe that the Advair was of benefit for the cough.Cough is  associated with  shortness of breath and wheezing .Sputum is described as clear & scant.   The 10/29/12 office visit was reviewed; she agrees with that history. She again denies any significant signs of rhinosinusitis such as frontal headache, facial pain, nasal purulence, sore throat, dental pain, otic pain, or otic discharge.  She does remember that she's had a similar episode after being exposed to fertilizer fumes last summer in addition to the med episode after being exposed a copy of hands.  She again denies a personal history of asthma although it is in a brother and her son.                            Review of Systems Fever,chills & sweats not present   Itchy , watery eyes & sneezing were not noted. Myalgias and arthralgias were not present       Objective:   Physical Exam General appearance:good health ;well nourished; no acute distress or increased work of breathing is present.  No  lymphadenopathy about the head, neck, or axilla noted.  Eyes: No conjunctival inflammation or lid edema is present. . Ears:  External ear exam shows no significant lesions or deformities.  Otoscopic examination reveals clear canals, tympanic membranes are intact bilaterally without bulging, retraction, inflammation or discharge. Nose:  External nasal examination shows no deformity or inflammation. Nasal mucosa are pink and moist without lesions or exudates. No septal dislocation or deviation.No obstruction to airflow.  Oral exam: Dental hygiene is good; lips and gums are healthy appearing.There is no oropharyngeal erythema or  exudate noted.  Neck:  No deformities,  masses, or tenderness noted.   Heart:  Normal rate and regular rhythm. S1 and S2 normal without gallop, murmur, click, rub or other extra sounds.  Lungs:Chest clear to auscultation; breath sounds are generally decreased. With inspiration she tends to have a harsh brassy, paroxysmal cough. This was associated with upper airway momentary airway compromise. No definite rub R thorax Extremities:  No cyanosis, edema, or clubbing  noted  Skin: Warm & dry w/o jaundice or tenting.        Assessment & Plan:  #1 asthmatic bronchitis; by history triggers have included copier fumes and fertilizer  Plan: See orders  & recommendations

## 2012-11-06 NOTE — Patient Instructions (Addendum)
Order for x-rays entered into  the computer; these will be performed at [facility]. No appointment is necessary.    

## 2012-11-16 ENCOUNTER — Other Ambulatory Visit: Payer: Self-pay

## 2012-11-20 ENCOUNTER — Telehealth: Payer: Self-pay | Admitting: Internal Medicine

## 2012-11-20 ENCOUNTER — Other Ambulatory Visit: Payer: Self-pay | Admitting: Internal Medicine

## 2012-11-20 DIAGNOSIS — R05 Cough: Secondary | ICD-10-CM

## 2012-11-20 NOTE — Telephone Encounter (Signed)
Patient states she still has a terrible cough and is not sure if she needs to see Dr. Alwyn Ren or if she needs a referral to specialist. CB# [phone]

## 2012-11-20 NOTE — Telephone Encounter (Signed)
Last OV was on 11/06/12, Hopp please advise

## 2012-11-28 ENCOUNTER — Ambulatory Visit (INDEPENDENT_AMBULATORY_CARE_PROVIDER_SITE_OTHER): Payer: Federal, State, Local not specified - PPO | Admitting: Critical Care Medicine

## 2012-11-28 ENCOUNTER — Encounter: Payer: Self-pay | Admitting: Critical Care Medicine

## 2012-11-28 VITALS — BP 122/82 | HR 95 | Temp 98.2°F | Ht 64.0 in | Wt 147.0 lb

## 2012-11-28 DIAGNOSIS — J310 Chronic rhinitis: Secondary | ICD-10-CM

## 2012-11-28 DIAGNOSIS — R05 Cough: Secondary | ICD-10-CM

## 2012-11-28 MED ORDER — MOMETASONE FUROATE 50 MCG/ACT NA SUSP: NASAL | Status: DC

## 2012-11-28 MED ORDER — FAMOTIDINE 40 MG PO TABS: 40.0000 mg | ORAL_TABLET | Freq: Every evening | ORAL | Status: DC

## 2012-11-28 MED ORDER — HYDROCODONE-HOMATROPINE 5-1.5 MG/5ML PO SYRP: 5.0000 mL | ORAL_SOLUTION | Freq: Four times a day (QID) | ORAL | Status: DC | PRN

## 2012-11-28 MED ORDER — METHYLPREDNISOLONE ACETATE 80 MG/ML IJ SUSP
120.0000 mg | Freq: Once | INTRAMUSCULAR | Status: AC
  Administered 2012-11-28: 120 mg via INTRAMUSCULAR

## 2012-11-28 MED ORDER — BENZONATATE 100 MG PO CAPS: ORAL_CAPSULE | ORAL | Status: DC

## 2012-11-28 MED ORDER — CHLORPHENIRAMINE TANNATE 12 MG PO TABS: 1.0000 | ORAL_TABLET | Freq: Every day | ORAL | Status: DC

## 2012-11-28 NOTE — Progress Notes (Signed)
Subjective:    Patient ID: Brooke Carpenter, female    DOB: Mar 13, 1946, 67 y.o.   MRN: [id number]  HPI Comments: Cough for 6 weeks. Started as productive now is dry. No URI symptoms at first .  Pt did note a similar.  episode summer 2013 after chemical exposure to lawn grass fertilizer.   Mucus was white at first now dry cough  Cough This is a new problem. The current episode started more than 1 month ago. The problem has been gradually improving (productive at first , now dry and slightly less). The problem occurs every few hours (was constant at first ). The cough is non-productive (productive of white mucus, now no mucus). Associated symptoms include chest pain, nasal congestion, postnasal drip, shortness of breath and wheezing. Pertinent negatives include no chills, ear congestion, ear pain, fever, headaches, heartburn, hemoptysis, myalgias, rash, rhinorrhea, sore throat, sweats or weight loss. Associated symptoms comments: Chest pain is musculoskeletal d/t coughing  Hoarseness, notes some dysphagia, hard time drinking water, needs to clear throat alot. The symptoms are aggravated by cold air, exercise, fumes, lying down and stress. She has tried steroid inhaler, a beta-agonist inhaler and oral steroids (nasonex helps , symbicort did not help , prednisone and zpak no help) for the symptoms. The treatment provided no relief. Her past medical history is significant for asthma. There is no history of bronchiectasis, bronchitis, COPD, emphysema, environmental allergies or pneumonia.  works at [employer] , open cubicles   Past Medical History  Diagnosis Date  . Breast cancer     mastectomy L 1994; lumpectomy R 2009  . Arthritis      Family History  Problem Relation Age of Onset  . Asthma    . Hypertension Mother   . Osteoarthritis Mother   . Coronary artery disease Mother   . Heart disease Mother   . Heart attack Brother 22  . Heart disease Brother     MI  . Asthma Brother   . Asthma  Son      History   Social History  . Marital Status: Divorced    Spouse Name: N/A    Number of Children: N/A  . Years of Education: N/A   Occupational History  . Not on file.   Social History Main Topics  . Smoking status: Never Smoker   . Smokeless tobacco: Not on file  . Alcohol Use: No     Comment:    . Drug Use: No  . Sexually Active: Not on file   Other Topics Concern  . Not on file   Social History Narrative  . No narrative on file     No Known Allergies   Outpatient Prescriptions Prior to Visit  Medication Sig Dispense Refill  . anastrozole (ARIMIDEX) 1 MG tablet TAKE ONE TABLET BY MOUTH ONE TIME DAILY  90 tablet  3  . FLUoxetine (PROZAC) 20 MG capsule TAKE ONE CAPSULE BY MOUTH ONE TIME DAILY  30 capsule  5  . mometasone (NASONEX) 50 MCG/ACT nasal spray 1 spray in each nostril twice a day as needed. Use the "crossover" technique as discussed  17 g  2  . azithromycin (ZITHROMAX Z-PAK) 250 MG tablet 2 day 1, then 1 qd  6 each  0  . budesonide-formoterol (SYMBICORT) 160-4.5 MCG/ACT inhaler Inhale 2 puffs into the lungs 2 (two) times daily.  1 Inhaler  3  . Fluticasone-Salmeterol (ADVAIR DISKUS) 250-50 MCG/DOSE AEPB Inhale 1 puff into the lungs 2 (two) times daily.  14 each  0  . HYDROcodone-homatropine (HYDROMET) 5-1.5 MG/5ML syrup Take 5 mLs by mouth every 6 (six) hours as needed for cough.  120 mL  0  . predniSONE (DELTASONE) 20 MG tablet Take 1 tablet (20 mg total) by mouth 2 (two) times daily.  14 tablet  0  . traMADol (ULTRAM) 50 MG tablet Take 1 tablet (50 mg total) by mouth every 8 (eight) hours as needed for pain.  30 tablet  0   No facility-administered medications prior to visit.      Review of Systems  Constitutional: Positive for appetite change and fatigue. Negative for fever, chills, weight loss, diaphoresis, activity change and unexpected weight change.  HENT: Positive for trouble swallowing, voice change, postnasal drip and tinnitus. Negative for  hearing loss, ear pain, nosebleeds, congestion, sore throat, facial swelling, rhinorrhea, sneezing, mouth sores, neck pain, neck stiffness, dental problem, sinus pressure and ear discharge.   Eyes: Negative for photophobia, discharge, itching and visual disturbance.  Respiratory: Positive for cough, choking, shortness of breath and wheezing. Negative for apnea, hemoptysis, chest tightness and stridor.   Cardiovascular: Positive for chest pain. Negative for palpitations and leg swelling.  Gastrointestinal: Negative for heartburn, nausea, vomiting, abdominal pain, constipation, blood in stool and abdominal distention.  Genitourinary: Positive for urgency and flank pain. Negative for dysuria, frequency, hematuria, decreased urine volume and difficulty urinating.  Musculoskeletal: Positive for arthralgias. Negative for myalgias, back pain, joint swelling and gait problem.  Skin: Negative for color change, pallor and rash.  Allergic/Immunologic: Negative for environmental allergies.  Neurological: Negative for dizziness, tremors, seizures, syncope, speech difficulty, weakness, light-headedness, numbness and headaches.  Hematological: Negative for adenopathy. Bruises/bleeds easily.  Psychiatric/Behavioral: Positive for sleep disturbance. Negative for confusion and agitation. The patient is nervous/anxious.        Objective:   Physical Exam Filed Vitals:   11/28/12 1555  BP: 122/82  Pulse: 95  Temp: 98.2 F (36.8 C)  TempSrc: Oral  Height: 5\' 4"  (1.626 m)  Weight: 147 lb (66.679 kg)  SpO2: 97%    Gen: Pleasant, well-nourished, in no distress,  normal affect  ENT: No lesions,  mouth clear,  oropharynx clear, ++postnasal drip, nasal inflammation without purulence  Neck: No JVD, no TMG, no carotid bruits  Lungs: No use of accessory muscles, no dullness to percussion, clear without rales or rhonchi, no wheezes   Cardiovascular: RRR, heart sounds normal, no murmur or gallops, no peripheral  edema  Abdomen: soft and NT, no HSM,  BS normal  Musculoskeletal: No deformities, no cyanosis or clubbing  Neuro: alert, non focal  Skin: Warm, no lesions or rashes  No results found.   CXR recently obtained: peribronchial thickening No pfts     Assessment & Plan:   Cough Cyclic cough likely on the basis of upper airway instability exacerbated by postnasal drip syndrome and reflux disease. The patient does have airway thickening on recent chest x-ray in a history that could be compatible with asthma. However the patient's failed to respond to systemic steroids and inhaled steroids. At this point we need to get the cough itself under better control and then evaluate lower airways later. Note the exam today does not reveal lower airway obstruction  Plan Pepcid 40mg  nightly  Chlorpheniramine 12mg  nightly  A depomedrol 120mg  injection was given Stop symbicort Increase nasonex to two puff each nostril daily Follow strict reflux diet Follow cough protocol, use hycodan and benzonatate Return 2 weeks for recheck with Tammy Parrett in [facility]  then see Dr Delford Field in 6 weeks     Updated Medication List Outpatient Encounter Prescriptions as of 11/28/2012  Medication Sig Dispense Refill  . anastrozole (ARIMIDEX) 1 MG tablet TAKE ONE TABLET BY MOUTH ONE TIME DAILY  90 tablet  3  . cholecalciferol (VITAMIN D) 1000 UNITS tablet Take 2,000 Units by mouth daily.      Marland Kitchen FLUoxetine (PROZAC) 20 MG capsule TAKE ONE CAPSULE BY MOUTH ONE TIME DAILY  30 capsule  5  . mometasone (NASONEX) 50 MCG/ACT nasal spray Two puff each nostril daily Use the "crossover" technique as discussed  17 g  2  . [DISCONTINUED] mometasone (NASONEX) 50 MCG/ACT nasal spray 1 spray in each nostril twice a day as needed. Use the "crossover" technique as discussed  17 g  2  . benzonatate (TESSALON) 100 MG capsule Take 1-2 every 6 hours per cough protocol  90 capsule  4  . Chlorpheniramine Tannate 12 MG TABS Take 1 tablet  (12 mg total) by mouth at bedtime.  30 each  4  . famotidine (PEPCID) 40 MG tablet Take 1 tablet (40 mg total) by mouth every evening.  30 tablet  1  . HYDROcodone-homatropine (HYCODAN) 5-1.5 MG/5ML syrup Take 5 mLs by mouth every 6 (six) hours as needed for cough.  240 mL  0  . [DISCONTINUED] azithromycin (ZITHROMAX Z-PAK) 250 MG tablet 2 day 1, then 1 qd  6 each  0  . [DISCONTINUED] budesonide-formoterol (SYMBICORT) 160-4.5 MCG/ACT inhaler Inhale 2 puffs into the lungs 2 (two) times daily.  1 Inhaler  3  . [DISCONTINUED] Fluticasone-Salmeterol (ADVAIR DISKUS) 250-50 MCG/DOSE AEPB Inhale 1 puff into the lungs 2 (two) times daily.  14 each  0  . [DISCONTINUED] HYDROcodone-homatropine (HYDROMET) 5-1.5 MG/5ML syrup Take 5 mLs by mouth every 6 (six) hours as needed for cough.  120 mL  0  . [DISCONTINUED] predniSONE (DELTASONE) 20 MG tablet Take 1 tablet (20 mg total) by mouth 2 (two) times daily.  14 tablet  0  . [DISCONTINUED] traMADol (ULTRAM) 50 MG tablet Take 1 tablet (50 mg total) by mouth every 8 (eight) hours as needed for pain.  30 tablet  0  . [EXPIRED] methylPREDNISolone acetate (DEPO-MEDROL) injection 120 mg        No facility-administered encounter medications on file as of 11/28/2012.

## 2012-11-28 NOTE — Assessment & Plan Note (Signed)
Cyclic cough likely on the basis of upper airway instability exacerbated by postnasal drip syndrome and reflux disease. The patient does have airway thickening on recent chest x-ray in a history that could be compatible with asthma. However the patient's failed to respond to systemic steroids and inhaled steroids. At this point we need to get the cough itself under better control and then evaluate lower airways later. Note the exam today does not reveal lower airway obstruction  Plan Pepcid 40mg  nightly  Chlorpheniramine 12mg  nightly  A depomedrol 120mg  injection was given Stop symbicort Increase nasonex to two puff each nostril daily Follow strict reflux diet Follow cough protocol, use hycodan and benzonatate Return 2 weeks for recheck with Tammy Parrett in [facility] then see Dr Delford Field in 6 weeks

## 2012-11-28 NOTE — Patient Instructions (Addendum)
Pepcid 40mg  nightly  Chlorpheniramine 12mg  nightly  A depomedrol 120mg  injection was given Stop symbicort Increase nasonex to two puff each nostril daily Follow strict reflux diet Follow cough protocol, use hycodan and benzonatate Return 2 weeks for recheck with Tammy Parrett in [facility] then see Dr Delford Field in 6 weeks

## 2012-11-29 ENCOUNTER — Telehealth: Payer: Self-pay | Admitting: Critical Care Medicine

## 2012-11-29 ENCOUNTER — Encounter: Payer: Self-pay | Admitting: *Deleted

## 2012-11-29 NOTE — Telephone Encounter (Signed)
Pt called back and wants the note to be mailed to her.Brooke Carpenter

## 2012-11-29 NOTE — Telephone Encounter (Signed)
Pt wanted to let Dr Delford Field know that she will start the cough protocol on Sunday, MArch 2 because her brother is coming in town and she wants to be able to tale to him while he is here.  PT also requests work note and notified that note was left at front desk for pick up.

## 2012-12-03 ENCOUNTER — Telehealth: Payer: Self-pay | Admitting: Critical Care Medicine

## 2012-12-03 NOTE — Telephone Encounter (Signed)
i am ok with extending the time by one day

## 2012-12-03 NOTE — Telephone Encounter (Signed)
  Called and spoke with pt and she is aware that PW is out of the office until 3/10.  Would like another note for work since she did stay out an extra day.  Pt will call back on Monday to check on this .  Wanted to make sure that PW was ok with doing another note for her.  PW please advise. Thanks  No Known Allergies

## 2012-12-04 ENCOUNTER — Encounter: Payer: Self-pay | Admitting: *Deleted

## 2012-12-04 NOTE — Telephone Encounter (Signed)
Letter completed.   [email or web address] to see if pt wants this mailed or if she would like to pick up letter.

## 2012-12-05 NOTE — Telephone Encounter (Signed)
[email or web address]  

## 2012-12-09 NOTE — Telephone Encounter (Signed)
Spoke with patient patient is aware that letter will be available for pickup tomorrow. Will forward to Crystal so that she is aware to leave it there. Nothing further needed at this time

## 2012-12-09 NOTE — Telephone Encounter (Signed)
Pt returned Crystal's call & can be reached at [phone].  Pt would like to p/u the letter @ the [facility] location tomorrow AM, please.  Antionette Fairy

## 2012-12-09 NOTE — Telephone Encounter (Signed)
Letter placed at front in [facility] for pick up.  Left detailed msg on pt's named VM advised requested letter is at [facility] for pick up and to call back with any further questions or concerns.

## 2012-12-10 ENCOUNTER — Encounter: Payer: Self-pay | Admitting: Adult Health

## 2012-12-10 ENCOUNTER — Institutional Professional Consult (permissible substitution): Payer: Federal, State, Local not specified - PPO | Admitting: Pulmonary Disease

## 2012-12-10 ENCOUNTER — Ambulatory Visit (INDEPENDENT_AMBULATORY_CARE_PROVIDER_SITE_OTHER): Payer: Federal, State, Local not specified - PPO | Admitting: Adult Health

## 2012-12-10 VITALS — BP 114/72 | HR 80 | Temp 97.9°F | Ht 64.0 in | Wt 146.0 lb

## 2012-12-10 DIAGNOSIS — R05 Cough: Secondary | ICD-10-CM

## 2012-12-10 DIAGNOSIS — R059 Cough, unspecified: Secondary | ICD-10-CM

## 2012-12-10 NOTE — Progress Notes (Signed)
  Subjective:    Patient ID: Brooke Carpenter, female    DOB: Mar 11, 1946, 67 y.o.   MRN: [id number]  HPI 67 yo female , never smoker, seen for initial pulmonary consult 11/28/12 for cough x 6 weeks   12/10/2012 2 week follow up for cough  Seen for initial pulmonary consult 2 weeks ago for cough x 6 weeks . She was started on cyclical cough regimen with GERD/AR /cough prevention regimen. Started on  Pepcid 40mg  nightly , Chlorpheniramine 12mg  nightly  depomedrol injection , symbicort was stopped.   hycodan and benzonatate. CXR showed NAD 11/06/12.   Cough is much better, 90% better.  Feels so much better.  Still has some nasal drip and drainage.  Using hycodan and tessalon with relief.  No overt reflux.  Ribs soreness from coughing is better.      Review of Systems Constitutional:   No  weight loss, night sweats,  Fevers, chills, fatigue, or  lassitude.  HEENT:   No headaches,  Difficulty swallowing,  Tooth/dental problems, or  Sore throat,                No sneezing, itching, ear ache,  +nasal congestion, post nasal drip,   CV:  No chest pain,  Orthopnea, PND, swelling in lower extremities, anasarca, dizziness, palpitations, syncope.   GI  No heartburn, indigestion, abdominal pain, nausea, vomiting, diarrhea, change in bowel habits, loss of appetite, bloody stools.   Resp: No shortness of breath with exertion or at rest.   No coughing up of blood.  No change in color of mucus.  No wheezing.  No chest wall deformity  Skin: no rash or lesions.  GU: no dysuria, change in color of urine, no urgency or frequency.  No flank pain, no hematuria   MS:  No joint pain or swelling.  No decreased range of motion.  No back pain.  Psych:  No change in mood or affect. No depression or anxiety.  No memory loss.          Objective:   Physical Exam GEN: A/Ox3; pleasant , NAD, well nourished   HEENT:  [place]/AT,  EACs-clear, TMs-wnl, NOSE-clear, THROAT-clear, no lesions, no postnasal drip or  exudate noted.   NECK:  Supple w/ fair ROM; no JVD; normal carotid impulses w/o bruits; no thyromegaly or nodules palpated; no lymphadenopathy.  RESP  Clear  P & A; w/o, wheezes/ rales/ or rhonchi.no accessory muscle use, no dullness to percussion  CARD:  RRR, no m/r/g  , no peripheral edema, pulses intact, no cyanosis or clubbing.  GI:   Soft & nt; nml bowel sounds; no organomegaly or masses detected.  Musco: Warm bil, no deformities or joint swelling noted.   Neuro: alert, no focal deficits noted.    Skin: Warm, no lesions or rashes         Assessment & Plan:

## 2012-12-10 NOTE — Patient Instructions (Addendum)
Continue on Pepcid 40mg  nightly for 4 weeks  Chlorpheniramine 12mg  nightly for 4 weeks  Follow strict reflux diet Follow cough protocol, use hycodan and benzonatate As needed  Cough .  follow up Dr. Delford Field  In 4 weeks and As needed

## 2012-12-10 NOTE — Assessment & Plan Note (Signed)
Cyclical cough is much improved on aggressive GERD/AR/cough prevention regimen  Pt is to continue on current regimen with Pepcid and Chlor tabs for  4 weeks .  Use Hycodan and Tessalon As needed   Follow up 4 weeks and .As needed

## 2013-01-09 ENCOUNTER — Encounter: Payer: Self-pay | Admitting: Critical Care Medicine

## 2013-01-09 ENCOUNTER — Ambulatory Visit (INDEPENDENT_AMBULATORY_CARE_PROVIDER_SITE_OTHER): Payer: Federal, State, Local not specified - PPO | Admitting: Critical Care Medicine

## 2013-01-09 VITALS — BP 118/70 | HR 92 | Ht 64.0 in | Wt 144.5 lb

## 2013-01-09 DIAGNOSIS — R059 Cough, unspecified: Secondary | ICD-10-CM

## 2013-01-09 DIAGNOSIS — R05 Cough: Secondary | ICD-10-CM

## 2013-01-09 MED ORDER — CHLORPHENIRAMINE MALEATE 4 MG PO TABS: 4.0000 mg | ORAL_TABLET | Freq: Every day | ORAL | Status: DC

## 2013-01-09 NOTE — Assessment & Plan Note (Signed)
Cyclic cough on the basis of upper airway instability and postnasal drip syndrome with associated reflux disease. There does not appear to be evidence for active lower airway inflammation or asthma The patient is improved with nasal steroids and as needed benzonatate. The patient has received too much drying of the mouth with high dose chlorpheniramine Plan Try chlorpheniramine and lower dose 4 mg bedtime along with continue Nasonex daily Use benzonatate as needed Follow reflux diet and maybe off H2 antagonist at this time Return as needed

## 2013-01-09 NOTE — Progress Notes (Addendum)
Subjective:    Patient ID: Brooke Carpenter, female    DOB: 1946-03-15, 67 y.o.   MRN: [id number]  HPI  67 yo female , never smoker, seen for initial pulmonary consult 11/28/12 for cough x 6 weeks   12/10/2012 2 week follow up for cough  Seen for initial pulmonary consult 2 weeks ago for cough x 6 weeks . She was started on cyclical cough regimen with GERD/AR /cough prevention regimen. Started on  Pepcid 40mg  nightly , Chlorpheniramine 12mg  nightly  depomedrol injection , symbicort was stopped.   hycodan and benzonatate. CXR showed NAD 11/06/12.   Cough is much better, 90% better.  Feels so much better.  Still has some nasal drip and drainage.  Using hycodan and tessalon with relief.  No overt reflux.  Ribs soreness from coughing is better.    01/09/2013 Cough is much better. Throat stays dry.  Water helps Still with some pndrip, nasonex Not using pepcid.  Follows reflux. Chlorpheniramine made pt too dry. Not needing benzonatate    Review of Systems  Constitutional:   No  weight loss, night sweats,  Fevers, chills, fatigue, or  lassitude.  HEENT:   No headaches,  Difficulty swallowing,  Tooth/dental problems, or  Sore throat,                No sneezing, itching, ear ache,  +nasal congestion,++ post nasal drip,   CV:  No chest pain,  Orthopnea, PND, swelling in lower extremities, anasarca, dizziness, palpitations, syncope.   GI  No heartburn, indigestion, abdominal pain, nausea, vomiting, diarrhea, change in bowel habits, loss of appetite, bloody stools.   Resp: No shortness of breath with exertion or at rest.   No coughing up of blood.  No change in color of mucus.  No wheezing.  No chest wall deformity  Skin: no rash or lesions.  GU: no dysuria, change in color of urine, no urgency or frequency.  No flank pain, no hematuria   MS:  No joint pain or swelling.  No decreased range of motion.  No back pain.  Psych:  No change in mood or affect. No depression or anxiety.  No  memory loss.          Objective:   Physical Exam BP 118/70  Pulse 92  Ht 5\' 4"  (1.626 m)  Wt 144 lb 8 oz (65.545 kg)  BMI 24.79 kg/m2  SpO2 98%  GEN: A/Ox3; pleasant , NAD, well nourished   HEENT:  [place]/AT,  EACs-clear, TMs-wnl, NOSE-clear, THROAT-clear, no lesions, no postnasal drip or exudate noted.   NECK:  Supple w/ fair ROM; no JVD; normal carotid impulses w/o bruits; no thyromegaly or nodules palpated; no lymphadenopathy.  RESP  Clear  P & A; w/o, wheezes/ rales/ or rhonchi.no accessory muscle use, no dullness to percussion  CARD:  RRR, no m/r/g  , no peripheral edema, pulses intact, no cyanosis or clubbing.  GI:   Soft & nt; nml bowel sounds; no organomegaly or masses detected.  Musco: Warm bil, no deformities or joint swelling noted.   Neuro: alert, no focal deficits noted.    Skin: Warm, no lesions or rashes         Assessment & Plan:   Cough Cyclic cough on the basis of upper airway instability and postnasal drip syndrome with associated reflux disease. There does not appear to be evidence for active lower airway inflammation or asthma The patient is improved with nasal steroids and as needed benzonatate. The patient has  received too much drying of the mouth with high dose chlorpheniramine Plan Try chlorpheniramine and lower dose 4 mg bedtime along with continue Nasonex daily Use benzonatate as needed Follow reflux diet and maybe off H2 antagonist at this time Return as needed    Updated Medication List Outpatient Encounter Prescriptions as of 01/09/2013  Medication Sig Dispense Refill  . anastrozole (ARIMIDEX) 1 MG tablet TAKE ONE TABLET BY MOUTH ONE TIME DAILY  90 tablet  3  . benzonatate (TESSALON) 100 MG capsule Take 1-2 every 6 hours per cough protocol  90 capsule  4  . cholecalciferol (VITAMIN D) 1000 UNITS tablet Take 2,000 Units by mouth daily.      Marland Kitchen FLUoxetine (PROZAC) 20 MG capsule TAKE ONE CAPSULE BY MOUTH ONE TIME DAILY  30 capsule  5  .  mometasone (NASONEX) 50 MCG/ACT nasal spray Two puff each nostril daily Use the "crossover" technique as discussed  17 g  2  . chlorpheniramine (CHLOR-TRIMETON) 4 MG tablet Take 1 tablet (4 mg total) by mouth at bedtime.  14 tablet  0  . [DISCONTINUED] Chlorpheniramine Tannate 12 MG TABS Take 1 tablet (12 mg total) by mouth at bedtime.  30 each  4  . [DISCONTINUED] famotidine (PEPCID) 40 MG tablet Take 1 tablet (40 mg total) by mouth every evening.  30 tablet  1   No facility-administered encounter medications on file as of 01/09/2013.

## 2013-01-09 NOTE — Patient Instructions (Addendum)
Try 4mg  chlorpheniramine nightly Use benzonatate as needed Stay on nasonex Return as needed

## 2013-01-27 ENCOUNTER — Ambulatory Visit
Admission: RE | Admit: 2013-01-27 | Discharge: 2013-01-27 | Disposition: A | Discharge status: Home / Self Care | Payer: Federal, State, Local not specified - PPO | Source: Ambulatory Visit | Attending: Physician Assistant | Admitting: Physician Assistant

## 2013-01-27 ENCOUNTER — Other Ambulatory Visit: Payer: Self-pay | Admitting: Physician Assistant

## 2013-01-27 DIAGNOSIS — Z853 Personal history of malignant neoplasm of breast: Secondary | ICD-10-CM

## 2013-02-03 ENCOUNTER — Other Ambulatory Visit: Payer: Self-pay | Admitting: Obstetrics and Gynecology

## 2013-02-21 ENCOUNTER — Encounter: Payer: Self-pay | Admitting: Internal Medicine

## 2013-02-21 ENCOUNTER — Ambulatory Visit (INDEPENDENT_AMBULATORY_CARE_PROVIDER_SITE_OTHER): Payer: Federal, State, Local not specified - PPO | Admitting: Internal Medicine

## 2013-02-21 VITALS — BP 112/66 | HR 83 | Temp 99.0°F | Wt 146.0 lb

## 2013-02-21 DIAGNOSIS — J029 Acute pharyngitis, unspecified: Secondary | ICD-10-CM

## 2013-02-21 DIAGNOSIS — J310 Chronic rhinitis: Secondary | ICD-10-CM

## 2013-02-21 LAB — POCT RAPID STREP A (OFFICE): Rapid Strep A Screen: NEGATIVE

## 2013-02-21 MED ORDER — FLUTICASONE PROPIONATE 50 MCG/ACT NA SUSP: 1.0000 | Freq: Two times a day (BID) | NASAL | Status: DC | PRN

## 2013-02-21 MED ORDER — AMOXICILLIN 500 MG PO CAPS: 500.0000 mg | ORAL_CAPSULE | Freq: Three times a day (TID) | ORAL | Status: DC

## 2013-02-21 NOTE — Progress Notes (Signed)
  Subjective:    Patient ID: Brooke Carpenter, female    DOB: 07/06/1946, 67 y.o.   MRN: [id number]  HPI  Symptoms began 02/19/13 as a sore throat which has progressed. This has been associated with some laryngitis. It was unresponsive to saline and Listerine gargles. She's also had some dental pain. She's also had postnasal drainage; she is not visualized the secretions.  She continues to have some cyclical cough. Dr. Lynelle Doctor workup was reviewed.  She was unaware of any fever; her temperature today is 99      Review of Systems  She denies associated frontal headache, facial pain, nasal purulence, otic pain, or otic discharge. She has no chills or sweats.  There's been no associated extrinsic symptomatology such as itchy, watery eyes or sneezing.     Objective:   Physical Exam  General appearance:good health ;well nourished; no acute distress or increased work of breathing is present.  No  lymphadenopathy about the head, neck, or axilla noted.   Eyes: No conjunctival inflammation or lid edema is present.   Ears:  External ear exam shows no significant lesions or deformities.  Otoscopic examination reveals clear canals, tympanic membranes are intact bilaterally without bulging, retraction, inflammation or discharge.  Nose:  External nasal examination shows no deformity or inflammation. Nasal mucosa are pink and moist without lesions or exudates. No septal dislocation or deviation.No obstruction to airflow.   Oral exam: Dental hygiene is good; lips and gums are healthy appearing.There is no oropharyngeal erythema or exudate noted. Slight laryngitis  Neck:  No deformities,  masses, or tenderness noted.   Supple with full range of motion without pain.   Heart:  Normal rate and regular rhythm. S1 and S2 normal without gallop, murmur, click, rub or other extra sounds.   Lungs:Chest clear to auscultation; minor rales at the bases present.No increased work of breathing.    Extremities:   No cyanosis, edema, or clubbing  noted    Skin: Warm & dry          Assessment & Plan:  #1 pharyngitis with negative strep but low-grade fever.  #2 cyclical cough. She feels that the nasal steroid was of benefit in controlling the cough in addition to antireflux measures.  Plan: See orders & recommendations

## 2013-02-21 NOTE — Patient Instructions (Addendum)
Plain Mucinex (NOT D) for thick secretions ;force NON dairy fluids .   Nasal cleansing in the shower as discussed with lather of mild shampoo.After 10 seconds wash off lather while  exhaling through nostrils. Make sure that all residual soap is removed to prevent irritation.  Fluticasone 1 spray in each nostril twice a day as needed. Use the "crossover" technique into opposite nostril spraying toward opposite ear @ 45 degree angle, not straight up into nostril.  Use a Neti pot daily only  as needed for significant sinus congestion; going from open side to congested side . Plain Allegra (NOT D )  160 daily , Loratidine 10 mg , OR Zyrtec 10 mg @ bedtime  as needed for itchy eyes & sneezing.   Reflux of gastric acid may be asymptomatic as this may occur mainly during sleep.The triggers for reflux  include stress; the "aspirin family" ; alcohol; peppermint; and caffeine (coffee, tea, cola, and chocolate). The aspirin family would include aspirin and the nonsteroidal agents such as ibuprofen &  Naproxen. Tylenol would not cause reflux. If having symptoms ; food & drink should be avoided for @ least 2 hours before going to bed.  Zicam Melts or Zinc lozenges as per package label for scratchy throat . Complementary options include  vitamin C 2000 mg daily; & Echinacea for 4-7 days. Report persistent or progressive fever; discolored nasal or chest secretions; or frontal headache or facial  pain. Fill Rx for these signs

## 2013-03-18 ENCOUNTER — Telehealth: Payer: Self-pay | Admitting: *Deleted

## 2013-03-18 NOTE — Telephone Encounter (Signed)
Lm informing the pt that GCM will be on call 08/18/13. Made the pt aware that her time of arrival had changed. gv d/t for labs and ov starting @ 2:45pm...td

## 2013-05-07 ENCOUNTER — Other Ambulatory Visit: Payer: Self-pay

## 2013-05-22 ENCOUNTER — Encounter (INDEPENDENT_AMBULATORY_CARE_PROVIDER_SITE_OTHER): Payer: Self-pay | Admitting: Surgery

## 2013-05-22 ENCOUNTER — Ambulatory Visit (INDEPENDENT_AMBULATORY_CARE_PROVIDER_SITE_OTHER): Payer: Federal, State, Local not specified - PPO | Admitting: Surgery

## 2013-05-22 VITALS — BP 116/68 | HR 80 | Temp 98.6°F | Resp 14 | Ht 64.5 in | Wt 145.0 lb

## 2013-05-22 DIAGNOSIS — Z853 Personal history of malignant neoplasm of breast: Secondary | ICD-10-CM

## 2013-05-22 NOTE — Progress Notes (Signed)
[facility]  Ovidio Kin, MD,  FACS [address] [place]  [address] [place], [place]    [address] Phone:  [phone] FAX:  [phone]   Re:   SEPTEMBER MORMILE DOB:   08-03-1946 MRN:   [id number]  ASSESSMENT AND PLAN: 1.  Left breast cancer  T2, N0 - Mastectomy and TRAM in 1993 in [place].  Has attractive rose tattoo across the top of the breast.  2.  RIght breast cancer  T1, N0 - lumpectomy - 02/11/2008  ER - 99%, PR - 100%, Ki67 - 15%, Her2Neu - neg  Radiation tx.  Medical oncology - Dr. Darnelle Catalan  Disease free.  I gave her a 5 year survivor pin.   She will see me back in one year.  3.  On Arimidex - sees Dr. Darnelle Catalan.  No complaints. 4.  Some pain in the left shoulder.  Nonspecific. 5.  She is retiring next month.  Plans to spend time with her daughter in [place].  HISTORY OF PRESENT ILLNESS: Chief Complaint  Patient presents with  . Breast Cancer Long Term Follow Up    yearly reck    Brooke Carpenter is a 67 y.o. (DOB: 08/11/1946)  white female who is a patient of Marga Melnick, MD and comes to me today for follow up for right breast cancer and remote left breast cancer.  She is doing well.  She has had some soreness in the LOQ of her right breast, but there has been no correlating mass.  She is in good spirits today.  Social History: She is retiring next month. Her daughter is at [employer] getting a Scientist, water quality in nursing.  Her daughter is interested in neonatology.  She did live in [place].  Her husband is still there.  Her daughter has their two children (2 and 4).  PHYSICAL EXAM: BP 116/68  Pulse 80  Temp(Src) 98.6 F (37 C) (Temporal)  Resp 14  Ht 5' 4.5" (1.638 m)  Wt 145 lb (65.772 kg)  BMI 24.51 kg/m2  HEENT:  Pupils equal.  Dentition good. NECK:  Supple.  No thyroid mass. LYMPH NODES:  No cervical, supraclavicular, or axillary adenopathy. BREASTS -  RIGHT:  No palpable mass or nodule.  No nipple discharge.  Scar at 10  o'clock.   LEFT:  TRAM reconstruction.  She has a rose tattoo across the top of the TRAM. UPPER EXTREMITIES:  No evidence of lymphedema.    DATA REVIEWED: Last mammogram was neg. - 01/27/2013 at [facility].  Ovidio Kin, MD, FACS Office:  [phone]

## 2013-06-25 ENCOUNTER — Other Ambulatory Visit: Payer: Self-pay | Admitting: Internal Medicine

## 2013-06-25 NOTE — Telephone Encounter (Signed)
Med filled.  

## 2013-06-25 NOTE — Telephone Encounter (Signed)
Last OV 02-21-13 Med filled 09-12-12 #30 with 5 refills  No contract on file and no UDS

## 2013-06-25 NOTE — Telephone Encounter (Signed)
OK X1, R X 2 

## 2013-08-04 ENCOUNTER — Ambulatory Visit (INDEPENDENT_AMBULATORY_CARE_PROVIDER_SITE_OTHER): Payer: Medicare Other | Admitting: *Deleted

## 2013-08-04 DIAGNOSIS — Z23 Encounter for immunization: Secondary | ICD-10-CM

## 2013-08-07 ENCOUNTER — Other Ambulatory Visit: Payer: Self-pay

## 2013-08-18 ENCOUNTER — Ambulatory Visit: Payer: Federal, State, Local not specified - PPO | Admitting: Oncology

## 2013-08-18 ENCOUNTER — Other Ambulatory Visit: Payer: Federal, State, Local not specified - PPO | Admitting: Lab

## 2013-08-19 ENCOUNTER — Ambulatory Visit: Payer: Federal, State, Local not specified - PPO

## 2013-08-19 ENCOUNTER — Ambulatory Visit (HOSPITAL_BASED_OUTPATIENT_CLINIC_OR_DEPARTMENT_OTHER): Payer: Federal, State, Local not specified - PPO | Admitting: Physician Assistant

## 2013-08-19 ENCOUNTER — Other Ambulatory Visit (HOSPITAL_BASED_OUTPATIENT_CLINIC_OR_DEPARTMENT_OTHER): Payer: Medicare Other | Admitting: Lab

## 2013-08-19 ENCOUNTER — Encounter: Payer: Self-pay | Admitting: Physician Assistant

## 2013-08-19 VITALS — BP 131/80 | HR 84 | Temp 97.6°F | Resp 18 | Ht 64.5 in | Wt 151.0 lb

## 2013-08-19 DIAGNOSIS — M8580 Other specified disorders of bone density and structure, unspecified site: Secondary | ICD-10-CM

## 2013-08-19 DIAGNOSIS — M533 Sacrococcygeal disorders, not elsewhere classified: Secondary | ICD-10-CM

## 2013-08-19 DIAGNOSIS — M545 Low back pain: Secondary | ICD-10-CM

## 2013-08-19 DIAGNOSIS — Z853 Personal history of malignant neoplasm of breast: Secondary | ICD-10-CM

## 2013-08-19 DIAGNOSIS — M899 Disorder of bone, unspecified: Secondary | ICD-10-CM

## 2013-08-19 LAB — CBC WITH DIFFERENTIAL/PLATELET
Basophils Absolute: 0 10*3/uL (ref 0.0–0.1)
Eosinophils Absolute: 0.2 10*3/uL (ref 0.0–0.5)
HCT: 35.9 % (ref 34.8–46.6)
HGB: 11.9 g/dL (ref 11.6–15.9)
MONO#: 0.6 10*3/uL (ref 0.1–0.9)
NEUT%: 53.2 % (ref 38.4–76.8)
WBC: 6.1 10*3/uL (ref 3.9–10.3)
lymph#: 2.1 10*3/uL (ref 0.9–3.3)

## 2013-08-19 LAB — COMPREHENSIVE METABOLIC PANEL (CC13)
ALT: 14 U/L (ref 0–55)
Anion Gap: 10 mEq/L (ref 3–11)
BUN: 13.3 mg/dL (ref 7.0–26.0)
CO2: 26 mEq/L (ref 22–29)
Calcium: 9.4 mg/dL (ref 8.4–10.4)
Chloride: 104 mEq/L (ref 98–109)
Creatinine: 0.7 mg/dL (ref 0.6–1.1)

## 2013-08-19 NOTE — Progress Notes (Signed)
ID: Brooke Carpenter   DOB: [birth year]  MR#: [id number]  GMW#:[id number]  PCP: Marga Melnick, MD GYN: Malva Limes, MD SU:  Ovidio Kin, MD OTHER MD:    CHIEF COMPLAINT:  Bilateral Breast Cancer    HISTORY OF PRESENT ILLNESS: Brooke Carpenter's breast cancer history actually goes up back to 1993, when she had a left mastectomy in [place], [place] for what to the best of my ability to tell from the available records was a 2-3 cm breast cancer, lymph node negative and most likely estrogen receptor negative since she was not treated with tamoxifen.  She did receive CAF chemotherapy times six and approximately a year after her mastectomy was started on Evista.  She also had TRAM reconstruction.  She had a screening mammogram January 15, 2008, which showed a possible mass in the right breast.  Spot compression views showed an irregular mass in the right subareolar area with no associated microcalcifications.  There was very minimal thickening palpable in that area and by ultrasound it was irregular and measured 1.2 cm.  The axilla was negative by ultrasound and a biopsy of this area was performed January 27, 2008, with the pathology there ([id number] and [id number]) showing an invasive ductal carcinoma, intermediate grade, 99% ER positive, 100% PR positive with a low proliferation marker at 15%, HER2/neu equivocal at 2+, but FISH negative with a ratio of 1.6.  With this information the patient was referred to Dr. Ovidio Kin and bilateral breast MRIs were obtained Feb 05, 2008.  This confirmed the presence of a 1.6 cm invasive ductal carcinoma in the anterior right breast at the 12 o'clock position.  There were no other masses or areas suspicious for malignancy on either side and no abnormal appearing lymph nodes were seen.  With this information the patient proceeded to right lumpectomy and sentinel lymph node biopsy on Feb 11, 2008.  The final pathology ([id number]) showed a grade 2 invasive ductal carcinoma  measuring 1.5 cm, with negative and ample margins, no evidence of lymphovascular invasion and with 0 of 1 sentinel lymph node involved.  She subsequently received radiation therapy, and was started on anastrozole in June 2009.  INTERVAL HISTORY: Brooke Carpenter returns today for a routine one year follow up for her breast cancer.  She continues on anastrzole, and has now completed 5 years on therapy. She has tolerated the medication well, and denies any problems with hot flashes, increased joint pain, or vaginal dryness. She does have a history of osteopenia, last evaluated by bone density in November of 2013. She is previously on the need for which she recently discontinued.  Interval history is notable for the fact that Brooke Carpenter recently retired from her job at the [employer]. She is looking forward to a "new phase of life", and hopes to spend more time with her grandchildren in the future.   REVIEW OF SYSTEMS: Brooke Carpenter denies any recent illnesses and has had no fevers, chills, or night sweats. She's had no skin changes and denies any abnormal bruising or bleeding. Her energy level is good. She does walk on a regular basis. Her appetite is good and she's had no problems with nausea or change in bowel or bladder habits. She has a little bit of a runny nose, but no cough, phlegm production, or shortness of breath. She's had no chest pain or palpitations. She has some muscle pain occasionally in the right upper arm, and has a history of several injuries to that area over the past few years. Most  recently, she tells me she also got both her flu shot and her pneumonia shot in the right upper arm. She's had no swelling or redness in the arm. Otherwise, she has some mild joint pain associated with arthritis, but denies any increased myalgias, arthralgias, or bony pain. She's had no abnormal headaches or dizziness. She has a history of depression which she tells me is well-controlled, and she denies suicidal  ideation.  A detailed review of systems is otherwise stable and noncontributory.   PAST MEDICAL HISTORY: Past Medical History  Diagnosis Date  . Breast cancer     mastectomy L 1994; lumpectomy R 2009  . Arthritis   . Cancer   . Depression   Significant for osteopenia, bone density February of 2007 showed osteoporosis, but a repeat April of 2009 after two years of [place]iva showed improvement to the osteopenia level.    PAST SURGICAL HISTORY: Past Surgical History  Procedure Laterality Date  . Mastectomy  1994    Left   . Knee arthroscopy  2009  . Breast lumpectomy  2009    Right, S/P radiation. Dr.Newman  . Colonoscopy  2009    Neg, Due 2019  . Joint replacement  11/15/2009    Knee    FAMILY HISTORY Family History  Problem Relation Age of Onset  . Asthma    . Hypertension Mother   . Osteoarthritis Mother   . Coronary artery disease Mother   . Heart disease Mother   . Heart attack Brother 16  . Heart disease Brother     MI  . Asthma Brother   . Asthma Son     GYNECOLOGIC HISTORY: She is Gx, P2, first pregnancy age 63.  She did nurse.  Her menstrual periods stopped with chemotherapy in 1993.  She originally had significant problems with hot flashes, but those have now resolved.  SOCIAL HISTORY:  She worked for the [employer] in [employer], broadcasting, but retired in late 2014.    She is divorced and lives by herself.  She attends [employer].  Her son Brooke Carpenter lives in [place] and works as an Airline pilot.  He has two adopted sons.  Her daughter Brooke Carpenter lives in [place] near [place].  The patient has a granddaughter in [place] whom she really enjoys visiting.   ADVANCED DIRECTIVES:  HEALTH MAINTENANCE:  (Updated November 2014) History  Substance Use Topics  . Smoking status: Never Smoker   . Smokeless tobacco: Never Used  . Alcohol Use: No     Comment:      COLONOSCOPY:  May 2008/Buccini (Repeat 10 yrs)  PAP:  May 2014/[facility]  BONE DENSITY:  November 2013, osteopenia  LIPIDS:  Not on file   No Known Allergies  Current Outpatient Prescriptions  Medication Sig Dispense Refill  . cholecalciferol (VITAMIN D) 1000 UNITS tablet Take 2,000 Units by mouth daily.      Marland Kitchen FLUoxetine (PROZAC) 20 MG capsule Take one capsule by mouth one time daily  30 capsule  2   No current facility-administered medications for this visit.    OBJECTIVE:  Middle-aged white female who appears comfortable and in no acute distress Filed Vitals:   08/19/13 1514  BP: 131/80  Pulse: 84  Temp: 97.6 F (36.4 C)  Resp: 18     Body mass index is 25.53 kg/(m^2).    ECOG FS: 0 Filed Weights   08/19/13 1514  Weight: 151 lb (68.493 kg)   Physical Exam: HEENT:  Sclerae anicteric.  Oropharynx clear.  Buccal mucosa is pink and moist. NODES:  No cervical or supraclavicular lymphadenopathy palpated.  BREAST EXAM: Right breast is status post lumpectomy with no suspicious nodularity or skin changes, no evidence of local recurrence. Patient is also status post left mastectomy with TRAM reconstruction, and no evidence of local recurrence. Axillae are benign bilaterally, no palpable lymphadenopathy. LUNGS:  Clear to auscultation bilaterally.  No wheezes or rhonchi. Good excursion bilaterally. HEART:  Regular rate and rhythm. No murmur  ABDOMEN:  Soft, nontender.  Positive bowel sounds.  MSK:  No focal spinal tenderness to palpation. Good range of motion in the upper extremities, including the right arm. EXTREMITIES:  No peripheral edema.   SKIN: There is no redness and no other skin changes noted in the right upper extremity, no sign of cellulitis. NEURO:  Nonfocal. Well oriented.  Positive affect.    LAB RESULTS: Lab Results  Component Value Date   WBC 6.1 08/19/2013   NEUTROABS 3.2 08/19/2013   HGB 11.9 08/19/2013   HCT 35.9 08/19/2013   MCV 92.5 08/19/2013   PLT 208 08/19/2013   CMP     Component Value Date/Time   NA 140 08/19/2013 1459   NA 137  02/25/2012 1834   K 3.7 08/19/2013 1459   K 3.7 06/19/2012 1648   CL 104 08/14/2012 1121   CL 96 02/25/2012 1834   CO2 26 08/19/2013 1459   CO2 27 02/25/2012 1834   GLUCOSE 83 08/19/2013 1459   GLUCOSE 86 08/14/2012 1121   GLUCOSE 99 02/25/2012 1834   BUN 13.3 08/19/2013 1459   BUN 11 02/25/2012 1834   CREATININE 0.7 08/19/2013 1459   CREATININE 0.50 02/25/2012 1834   CREATININE 0.69 01/05/2011 1632   CALCIUM 9.4 08/19/2013 1459   CALCIUM 9.0 06/19/2012 1648   PROT 7.3 08/19/2013 1459   PROT 6.8 08/04/2011 1241   ALBUMIN 3.9 08/19/2013 1459   ALBUMIN 4.3 08/04/2011 1241   AST 20 08/19/2013 1459   AST 22 08/04/2011 1241   ALT 14 08/19/2013 1459   ALT 21 08/04/2011 1241   ALKPHOS 120 08/19/2013 1459   ALKPHOS 91 08/04/2011 1241   BILITOT 0.31 08/19/2013 1459   BILITOT 0.4 08/04/2011 1241   GFRNONAA >90 02/25/2012 1834   GFRAA >90 02/25/2012 1834    Vitamin D 25 Hydroxy is pending, 08/19/2013.   STUDIES:  Most recent bone density 08/23/2012 showed osteopenia.  A right mammogram on 01/27/2013 was unremarkable.     ASSESSMENT: 67 y.o.  Pura Spice woman with   1) Left sided breast cancer, s/p mastectomy and TRAM reconstruction 1993 for a T2 N0 Grade 1 invasive ductal carcinoma, estrogen and progesterone receptor positive, treated with cyclophosphamide/doxorubicin/fluorouracil x6 followed by raloxefine for one year   2) s/p Right lumpectomy and sentinel lymph node sampling May 2009 for a T1c N0 Grade 2 invasive ductal carcinoma, strongly estrogen and progesteronme receptor positive, HER-2 negative, with an MIB-1 of 15%,   3)  s/p radiation completed August 2009,   4)  on anastrozole starting June 2009, continuing on this with good tolerance.  Plan is to complete a total of 5 years.   PLAN: With regards to her breast cancer, Fraida  has done extremely well, and is now over 5 years out from her definitive surgery in May of 2009. She's also completed all 5 years on the anastrozole  which she tolerated very well. Dr. Darnelle Catalan has also spoken with the patient today, and we are ready to "graduate" her today from followup.  She knows that we will keep her medical records for 10 years, and she is welcome to call us with any problems or questions in the future. In the meanwhile, we will release her back to Dr. Alwyn Ren who is her current primary care physician. She understands that she will need an annual clinical breast exam, as well as an annual mammogram. is doing quite well, and will continue on anastrozole for one more year. She is due for her next bone density exam which will be ordered now. Her next annual mammogram will be in April 2014, and she'll return to see [place] in one year.  Lurline is very much in favor with this plan, and voices her understanding and agreement.   Zohra Clavel PA-C   08/19/2013

## 2013-08-20 LAB — VITAMIN D 25 HYDROXY (VIT D DEFICIENCY, FRACTURES): Vit D, 25-Hydroxy: 41 ng/mL (ref 30–89)

## 2013-08-21 ENCOUNTER — Ambulatory Visit: Payer: Federal, State, Local not specified - PPO

## 2013-08-22 ENCOUNTER — Telehealth: Payer: Self-pay | Admitting: Internal Medicine

## 2013-08-22 NOTE — Telephone Encounter (Signed)
Patient Will Follow Care Advice:  YES

## 2013-08-22 NOTE — Telephone Encounter (Signed)
Patient Information:  Caller Name: Jadalyn  Phone: [phone]  Patient: Brooke Carpenter, Brooke Carpenter  Gender: Female  DOB: [birth year]  Age: 67 Years  PCP: Marga Melnick  Office Follow Up:  Does the office need to follow up with this patient?: No  Instructions For The Office: N/A  RN Note:  Sore throat resolved. Hydrate and humidify. Expect sympotms to last 10-14 days.  Call back if symptoms worsen.  Symptoms  Reason For Call & Symptoms: Cold symptoms mainly nasal congestion and rhinorrhea; called to ask what to take since going out of town with freinds starting 08/23/13. Oncologist stopped Anastrozole 08/19/13.  Was caring for grandchildren with colds.  Reviewed Health History In EMR: Yes  Reviewed Medications In EMR: Yes  Reviewed Allergies In EMR: Yes  Reviewed Surgeries / Procedures: Yes  Date of Onset of Symptoms: 08/21/2013  Treatments Tried: Zycam, Nyquil, Advil  Treatments Tried Worked: No  Guideline(s) Used:  Colds  Disposition Per Guideline:   Home Care  Reason For Disposition Reached:   Colds with no complications  Advice Given:  Reassurance  It sounds like an uncomplicated cold that we can treat at home.  Colds are very common and may make you feel uncomfortable.  Colds are caused by viruses, and no medicine or "shot" will cure an uncomplicated cold.  Colds are usually not serious.  Here is some care advice that should help.  For a Runny Nose With Profuse Discharge:   Nasal mucus and discharge helps to wash viruses and bacteria out of the nose and sinuses.  Blowing the nose is all that is needed.  Medicines for a Stuffy or Runny Nose:  Most cold medicines that are available over-the-counter (OTC) are not helpful.  Antihistamines: Are only helpful if you also have nasal allergies.  If you have a very runny nose and you really think you need a medicine, you can try using a nasal decongestant for a couple days.  Patient Will Follow Care Advice:  YES

## 2013-08-24 ENCOUNTER — Other Ambulatory Visit: Payer: Self-pay | Admitting: Oncology

## 2013-08-25 ENCOUNTER — Other Ambulatory Visit: Payer: Self-pay | Admitting: *Deleted

## 2013-09-29 ENCOUNTER — Ambulatory Visit (INDEPENDENT_AMBULATORY_CARE_PROVIDER_SITE_OTHER): Payer: Medicare Other | Admitting: Nurse Practitioner

## 2013-09-29 ENCOUNTER — Encounter: Payer: Self-pay | Admitting: Nurse Practitioner

## 2013-09-29 VITALS — BP 110/60 | HR 98 | Temp 98.2°F | Ht 64.0 in | Wt 155.2 lb

## 2013-09-29 DIAGNOSIS — N390 Urinary tract infection, site not specified: Secondary | ICD-10-CM

## 2013-09-29 LAB — POCT URINALYSIS DIPSTICK
Glucose, UA: NEGATIVE
Ketones, UA: NEGATIVE
Spec Grav, UA: >=1.03
Urobilinogen, UA: 0.2

## 2013-09-29 MED ORDER — PHENAZOPYRIDINE HCL 200 MG PO TABS: 200.0000 mg | ORAL_TABLET | Freq: Three times a day (TID) | ORAL | Status: DC | PRN

## 2013-09-29 MED ORDER — NITROFURANTOIN MONOHYD MACRO 100 MG PO CAPS: 100.0000 mg | ORAL_CAPSULE | Freq: Two times a day (BID) | ORAL | Status: DC

## 2013-09-29 NOTE — Progress Notes (Signed)
Pre-visit discussion using our clinic review tool. No additional management support is needed unless otherwise documented below in the visit note.  

## 2013-09-29 NOTE — Patient Instructions (Signed)
Sip fluids every hour, start antibiotic. Take pyridium to relax bladder & help with feeling of pressure. Seek re-evaluation if you develop back pain or fever.  Urinary Tract Infection Urinary tract infections (UTIs) can develop anywhere along your urinary tract. Your urinary tract is your body's drainage system for removing wastes and extra water. Your urinary tract includes two kidneys, two ureters, a bladder, and a urethra. Your kidneys are a pair of bean-shaped organs. Each kidney is about the size of your fist. They are located below your ribs, one on each side of your spine. CAUSES Infections are caused by microbes, which are microscopic organisms, including fungi, viruses, and bacteria. These organisms are so small that they can only be seen through a microscope. Bacteria are the microbes that most commonly cause UTIs. SYMPTOMS  Symptoms of UTIs may vary by age and gender of the patient and by the location of the infection. Symptoms in young women typically include a frequent and intense urge to urinate and a painful, burning feeling in the bladder or urethra during urination. Older women and men are more likely to be tired, shaky, and weak and have muscle aches and abdominal pain. A fever may mean the infection is in your kidneys. Other symptoms of a kidney infection include pain in your back or sides below the ribs, nausea, and vomiting. DIAGNOSIS To diagnose a UTI, your caregiver will ask you about your symptoms. Your caregiver also will ask to provide a urine sample. The urine sample will be tested for bacteria and white blood cells. White blood cells are made by your body to help fight infection. TREATMENT  Typically, UTIs can be treated with medication. Because most UTIs are caused by a bacterial infection, they usually can be treated with the use of antibiotics. The choice of antibiotic and length of treatment depend on your symptoms and the type of bacteria causing your infection. HOME CARE  INSTRUCTIONS  If you were prescribed antibiotics, take them exactly as your caregiver instructs you. Finish the medication even if you feel better after you have only taken some of the medication.  Drink enough water and fluids to keep your urine clear or pale yellow.  Avoid caffeine, tea, and carbonated beverages. They tend to irritate your bladder.  Empty your bladder often. Avoid holding urine for long periods of time.  Empty your bladder before and after sexual intercourse.  After a bowel movement, women should cleanse from front to back. Use each tissue only once. SEEK MEDICAL CARE IF:   You have back pain.  You develop a fever.  Your symptoms do not begin to resolve within 3 days. SEEK IMMEDIATE MEDICAL CARE IF:   You have severe back pain or lower abdominal pain.  You develop chills.  You have nausea or vomiting.  You have continued burning or discomfort with urination. MAKE SURE YOU:   Understand these instructions.  Will watch your condition.  Will get help right away if you are not doing well or get worse. Document Released: 06/28/2005 Document Revised: 03/19/2012 Document Reviewed: 10/27/2011 [facility] Patient Information 2014 [place], [place].

## 2013-09-29 NOTE — Addendum Note (Signed)
Addended by: Marlene Lard on: 09/29/2013 03:23 PM   Modules accepted: Orders

## 2013-09-29 NOTE — Progress Notes (Signed)
   Subjective:    Patient ID: Brooke Carpenter, female    DOB: 09/04/1946, 67 y.o.   MRN: [id number]  Urinary Tract Infection  This is a new problem. The current episode started today. The problem occurs every urination. The problem has been gradually worsening. The quality of the pain is described as burning. The pain is mild. There has been no fever. She is not sexually active. There is no history of pyelonephritis. Associated symptoms include frequency, hematuria and urgency. Pertinent negatives include no chills, discharge, flank pain, hesitancy, nausea or vomiting. She has tried nothing for the symptoms.      Review of Systems  Constitutional: Negative for fever and chills.  Gastrointestinal: Positive for abdominal pain (suprapubic pressure). Negative for nausea, vomiting, diarrhea and constipation.  Genitourinary: Positive for dysuria, urgency, frequency, hematuria and pelvic pain. Negative for hesitancy, flank pain, vaginal discharge, vaginal pain and menstrual problem.  Musculoskeletal: Positive for back pain (chronic, unchanged w/current symptoms.).  Hematological: Negative for adenopathy.       Objective:   Physical Exam  Vitals reviewed. Constitutional: She is oriented to person, place, and time. She appears well-developed and well-nourished. No distress.  HENT:  Head: Normocephalic and atraumatic.  Eyes: Conjunctivae are normal. Right eye exhibits no discharge. Left eye exhibits no discharge.  Cardiovascular: Normal rate.   No murmur heard. Abdominal: Soft. Bowel sounds are normal. She exhibits no distension and no mass. There is no tenderness. There is no rebound and no guarding.  Musculoskeletal: She exhibits no tenderness (no cva tenderness).  Neurological: She is alert and oriented to person, place, and time.  Skin: Skin is warm and dry.  Psychiatric: She has a normal mood and affect. Her behavior is normal. Thought content normal.          Assessment & Plan:    1. Infection of urinary tract POC ua- +leuks, blood, protein Urine cx pending - nitrofurantoin, macrocrystal-monohydrate, (MACROBID) 100 MG capsule; Take 1 capsule (100 mg total) by mouth 2 (two) times daily.  Dispense: 14 capsule; Refill: 0 - phenazopyridine (PYRIDIUM) 200 MG tablet; Take 1 tablet (200 mg total) by mouth 3 (three) times daily as needed for pain.  Dispense: 10 tablet; Refill: 0

## 2013-10-01 LAB — URINE CULTURE: Colony Count: 70000

## 2013-12-08 ENCOUNTER — Encounter: Payer: Self-pay | Admitting: Internal Medicine

## 2013-12-08 ENCOUNTER — Ambulatory Visit (INDEPENDENT_AMBULATORY_CARE_PROVIDER_SITE_OTHER): Payer: Medicare Other | Admitting: Internal Medicine

## 2013-12-08 VITALS — BP 128/66 | HR 87 | Temp 97.8°F | Wt 154.8 lb

## 2013-12-08 DIAGNOSIS — M25519 Pain in unspecified shoulder: Secondary | ICD-10-CM

## 2013-12-08 DIAGNOSIS — M25511 Pain in right shoulder: Secondary | ICD-10-CM

## 2013-12-08 NOTE — Patient Instructions (Signed)
The Sports Medicine referral will be scheduled and you'll be notified of the time.Take Aleve one to 2 every 8-12 hours with food as needed.

## 2013-12-08 NOTE — Progress Notes (Signed)
Pre visit review using our clinic review tool, if applicable. No additional management support is needed unless otherwise documented below in the visit note. 

## 2013-12-08 NOTE — Progress Notes (Signed)
   Subjective:    Patient ID: Brooke Carpenter, female    DOB: [birth year], 68 y.o.   MRN: [id number]  HPI   Symptoms of R upper arm pain have actually been present for approximately 2 years after an injury sustained in Pilates. Exacerbation in August of last year when she fell on the right arm. The impact was mainly at the wrist.  She now describes a dull intermittent pain up to a level V-8, mainly at night at the right lateral shoulder area. This pain is better with lateral rotation of the shoulder.with RUE extended   The pain is increased when she took a pneumonia shot. Distally to the right lateral forearm.  On one occasion at night < 1 week ago she noted soreness in the triceps area and right lateral chest. Massage maneuvers by her were of benefit.  There's been some response to nonsteroidals; only slightly better with Tylenol    Review of Systems  She has not noticed any change in color or temperature of the skin in this area. There's been no associated fever, chills, sweats, weight change.  She has no numbness, tingling, or weakness in the right upper extremity.  She's had no incontinence of urine or stool.  She has had axillary lymph resection bilaterally (sentinel node only on R); she's noted no increased lymph nodes. The only  bruising was associated with taking aspirin.        Objective:   Physical Exam  She appears healthy and well-nourished in no distress  She has no lymphadenopathy about the neck or axilla  There is full range of motion of the cervical spine without associated pain or discomfort in the right upper extremity  There is no evidence of cervical radiculopathy.  She has no pain with extension of the right upper extremity above her head  There is full range of motion of right upper extremity. Initially she had pain to opposition with the right upper extremity  perpendicular to the thorax.  Deep tendon reflexes, strength, and tone are normal  She  has mixed arthritic changes with predominant DIP osteoarthritic changes      Assessment & Plan:  #1 tendinitis versus bursitis  Plan: See orders

## 2013-12-09 ENCOUNTER — Telehealth: Payer: Self-pay

## 2013-12-09 NOTE — Telephone Encounter (Signed)
Did pt need anything from [place]? Unable to reach pt.

## 2013-12-09 NOTE — Telephone Encounter (Signed)
Not sure what pt needs, pls investigate.

## 2013-12-09 NOTE — Telephone Encounter (Signed)
I forwarded a visit summary that the patient answered on

## 2013-12-09 NOTE — Telephone Encounter (Signed)
Layne aware.

## 2013-12-22 ENCOUNTER — Other Ambulatory Visit (INDEPENDENT_AMBULATORY_CARE_PROVIDER_SITE_OTHER): Payer: Medicare Other

## 2013-12-22 ENCOUNTER — Encounter: Payer: Self-pay | Admitting: Family Medicine

## 2013-12-22 ENCOUNTER — Ambulatory Visit (INDEPENDENT_AMBULATORY_CARE_PROVIDER_SITE_OTHER): Payer: Medicare Other | Admitting: Family Medicine

## 2013-12-22 VITALS — BP 132/78 | HR 94 | Wt 156.0 lb

## 2013-12-22 DIAGNOSIS — M25511 Pain in right shoulder: Secondary | ICD-10-CM

## 2013-12-22 DIAGNOSIS — M25519 Pain in unspecified shoulder: Secondary | ICD-10-CM

## 2013-12-22 DIAGNOSIS — M7550 Bursitis of unspecified shoulder: Secondary | ICD-10-CM | POA: Insufficient documentation

## 2013-12-22 DIAGNOSIS — IMO0002 Reserved for concepts with insufficient information to code with codable children: Secondary | ICD-10-CM

## 2013-12-22 DIAGNOSIS — M75100 Unspecified rotator cuff tear or rupture of unspecified shoulder, not specified as traumatic: Secondary | ICD-10-CM

## 2013-12-22 NOTE — Patient Instructions (Signed)
Very nice to meet you Try exercises 3 times a week Ice 20 minutes 2 times daily.  Take tylenol 650 mg three times a day is the best evidence based medicine we have for arthritis.  Glucosamine sulfate 750mg  twice a day is a supplement that has been shown to help moderate to severe arthritis. Vitamin D 2000 IU daily Fish oil 2 grams daily.  Tumeric 500mg  twice daily.  Capsaicin topically up to four times a day may also help with pain. Cortisone injections are an option if these interventions do not seem to make a difference or need more relief. We did one today.  If cortisone injections do not help, there are different types of shots that may help but they take longer to take effect.  We can discuss this at follow up.  It's important that you continue to stay active. Consider physical therapy to strengthen muscles around the joint that hurts to take pressure off of the joint itself. Shoe inserts with good arch support may be helpful.  [employer] orthotics at [employer] [facility] could help.  Water aerobics and cycling with low resistance are the best two types of exercise for arthritis. Come back and see me in 3 weeks.

## 2013-12-22 NOTE — Assessment & Plan Note (Signed)
Patient did have an injection today which had near complete resolution of pain. Patient will do over-the-counter and natural supplementations to try to help with her underlying osteoarthritic pain. Patient does have past medical history significant for breast cancer and I would like to get an x-ray of the right shoulder. Unfortunately the x-ray is down and at followup we will get the x-ray. Home exercise program was given an handout for discuss proper technique Discussed icing protocol Patient will come back again in 3 weeks for further evaluation.

## 2013-12-22 NOTE — Progress Notes (Signed)
[facility] [address] [place], [place] [address] Phone: [phone] Subjective:    I'm seeing this patient by the request  of: Unice Cobble, MD  CC: Right shoulder pain  [id number] Brooke Carpenter is a 68 y.o. female coming in with complaint of right shoulder pain. Patient does have a past medical history significant for breast cancer bilaterally, as well as osteoarthritic changes in multiple joints. Patient states his right shoulder and arm pain has been going on for approximately 2 years. Patient states that she thinks there is an injury that was sustained while she was doing Pilates. Patient states that she's been having more of a is worse with certain movements especially overhead activity. Patient rates the severity more of 7/10. Patient states the pain seems to be worse at night no numbness or weakness of the upper [place].    Past medical history, social, surgical and family history all reviewed in electronic medical record.   Review of Systems: No headache, visual changes, nausea, vomiting, diarrhea, constipation, dizziness, abdominal pain, skin rash, fevers, chills, night sweats, weight loss, swollen lymph nodes, body aches, joint swelling, muscle aches, chest pain, shortness of breath, mood changes.   Objective Blood pressure 132/78, pulse 94, weight 156 lb (70.761 kg), SpO2 97.00%.  General: No apparent distress alert and oriented x3 mood and affect normal, dressed appropriately.  HEENT: Pupils equal, extraocular movements intact  Respiratory: Patient's speak in full sentences and does not appear short of breath  Cardiovascular: No lower extremity edema, non tender, no erythema  Skin: Warm dry intact with no signs of infection or rash on extremities or on axial skeleton.  Abdomen: Soft nontender  Neuro: Cranial nerves II through XII are intact, neurovascularly intact in all extremities with 2+ DTRs and 2+ pulses.  Lymph: No  lymphadenopathy of posterior or anterior cervical chain or axillae bilaterally.  Gait normal with good balance and coordination.  MSK:  Non tender with full range of motion and good stability and symmetric strength and tone of  elbows, wrist, hip, knee and ankles bilaterally.  Shoulder: Right Inspection reveals no abnormalities, atrophy or asymmetry. Palpation is normal with no tenderness over AC joint or bicipital groove. ROM is full in all planes passively Rotator cuff strength normal throughout.  signs of impingement with positive Neer and Hawkin's tests, negative empty can sign. Speeds and Yergason's tests normal. No labral pathology noted with negative Obrien's, negative clunk and good stability. Normal scapular function observed. No painful arc and no drop arm sign. No apprehension sign  MSK US performed of: Right This study was ordered, performed, and interpreted by Charlann Boxer D.O.  Shoulder:   Supraspinatus:  Appears normal on long and transverse views,  bursal bulge seen with shoulder abduction on impingement view. Infraspinatus:  Appears normal on long and transverse views. Subscapularis:  Appears normal on long and transverse views. Mild bursitis noted Teres Minor:  Appears normal on long and transverse views. AC joint:  Capsule undistended, no geyser sign. Glenohumeral Joint:  Mild osteoarthritic changes Glenoid Labrum:  Intact without visualized tears. Biceps Tendon:  Appears normal on long and transverse views, no fraying of tendon, tendon located in intertubercular groove, no subluxation with shoulder internal or external rotation. No increased power doppler signal.  Impression: Subacromial bursitis with mild osteoarthritis  Procedure: Real-time Ultrasound Guided Injection of right glenohumeral joint Device: GE Logiq E  Ultrasound guided injection is preferred based studies that show increased duration, increased effect, greater accuracy,  decreased procedural pain,  increased response rate with ultrasound guided versus blind injection.  Verbal informed consent obtained.  Time-out conducted.  Noted no overlying erythema, induration, or other signs of local infection.  Skin prepped in a sterile fashion.  Local anesthesia: Topical Ethyl chloride.  With sterile technique and under real time ultrasound guidance:  Joint visualized.  23g 1  inch needle inserted posterior approach. Pictures taken for needle placement. Patient did have injection of 2 cc of 1% lidocaine, 2 cc of 0.5% Marcaine, and 1.0 cc of Kenalog 40 mg/dL. Completed without difficulty  Pain immediately resolved suggesting accurate placement of the medication.  Advised to call if fevers/chills, erythema, induration, drainage, or persistent bleeding.  Images permanently stored and available for review in the ultrasound unit.  Impression: Technically successful ultrasound guided injection.     Impression and Recommendations:     This case required medical decision making of moderate complexity.

## 2013-12-23 ENCOUNTER — Other Ambulatory Visit: Payer: Self-pay | Admitting: Obstetrics and Gynecology

## 2013-12-23 DIAGNOSIS — Z9012 Acquired absence of left breast and nipple: Secondary | ICD-10-CM

## 2013-12-23 DIAGNOSIS — Z853 Personal history of malignant neoplasm of breast: Secondary | ICD-10-CM

## 2013-12-23 DIAGNOSIS — Z9889 Other specified postprocedural states: Secondary | ICD-10-CM

## 2014-01-12 ENCOUNTER — Ambulatory Visit (INDEPENDENT_AMBULATORY_CARE_PROVIDER_SITE_OTHER): Payer: Medicare Other | Admitting: Family Medicine

## 2014-01-12 ENCOUNTER — Encounter: Payer: Self-pay | Admitting: Family Medicine

## 2014-01-12 VITALS — BP 122/70 | HR 93 | Wt 154.0 lb

## 2014-01-12 DIAGNOSIS — IMO0002 Reserved for concepts with insufficient information to code with codable children: Secondary | ICD-10-CM

## 2014-01-12 DIAGNOSIS — M7550 Bursitis of unspecified shoulder: Secondary | ICD-10-CM

## 2014-01-12 DIAGNOSIS — M75100 Unspecified rotator cuff tear or rupture of unspecified shoulder, not specified as traumatic: Secondary | ICD-10-CM

## 2014-01-12 NOTE — Patient Instructions (Signed)
Good to see you Do exercises 2-3 times a week for next 6 weeks at least Ice if any pain See me when you need me.

## 2014-01-12 NOTE — Assessment & Plan Note (Signed)
Patient does have some underlying osteoarthritis and may have flares in the future but we discussed continuing exercises on a regular basis. We discussed icing protocol and continuing this as well. Patient will follow more on an as-needed basis as long as patient continues to do well. The patient does have more pain I would consider getting x-rays. We discussed this in great detail with patient today and she declined even though she has a history of breast cancer. With patient improving so much with the injection to I'm highly optimistic this is not from metastasis. Patient will followup on an as-needed basis.  Spent greater than 25 minutes with patient face-to-face and had greater than 50% of counseling including as described above in assessment and plan.

## 2014-01-12 NOTE — Progress Notes (Signed)
  [name] [place] [place], [place] [address] Phone: [phone] Subjective:    CC: Right shoulder pain, follow up.   [id number] Brooke Carpenter is a 68 y.o. female coming in with complaint of right shoulder pain. Patient was seen previously and was diagnosed with a subacromial bursitis. Patient did have an injection and states that her pain has completely resolved since this time. Patient denies any weakness, any numbness, any discomfort with the shoulder. Patient has been able to use it for all her activities of daily living and is sleeping comfortably. Patient is very happy with the results.    Past medical history, social, surgical and family history all reviewed in electronic medical record.   Review of Systems: No headache, visual changes, nausea, vomiting, diarrhea, constipation, dizziness, abdominal pain, skin rash, fevers, chills, night sweats, weight loss, swollen lymph nodes, body aches, joint swelling, muscle aches, chest pain, shortness of breath, mood changes.   Objective Blood pressure 122/70, pulse 93, weight 154 lb (69.854 kg), SpO2 96.00%.  General: No apparent distress alert and oriented x3 mood and affect normal, dressed appropriately.  HEENT: Pupils equal, extraocular movements intact  Respiratory: Patient's speak in full sentences and does not appear short of breath  Cardiovascular: No lower extremity edema, non tender, no erythema  Skin: Warm dry intact with no signs of infection or rash on extremities or on axial skeleton.  Abdomen: Soft nontender  Neuro: Cranial nerves II through XII are intact, neurovascularly intact in all extremities with 2+ DTRs and 2+ pulses.  Lymph: No lymphadenopathy of posterior or anterior cervical chain or axillae bilaterally.  Gait normal with good balance and coordination.  MSK:  Non tender with full range of motion and good stability and symmetric strength and tone of  elbows, wrist, hip,  knee and ankles bilaterally.  Shoulder: Right Inspection reveals no abnormalities, atrophy or asymmetry. Palpation is normal with no tenderness over AC joint or bicipital groove. ROM is full in all planes passively and actively which is an improvement Rotator cuff strength normal throughout. No signs of impingement with neck Neer and Hawkin's tests, negative empty can sign. Speeds and Yergason's tests normal. No labral pathology noted with negative Obrien's, negative clunk and good stability. Normal scapular function observed. No painful arc and no drop arm sign. No apprehension sign    Impression and Recommendations:

## 2014-01-16 ENCOUNTER — Other Ambulatory Visit: Payer: Self-pay | Admitting: Critical Care Medicine

## 2014-01-19 NOTE — Telephone Encounter (Signed)
Pt will need to make an OV for refills.

## 2014-01-20 ENCOUNTER — Telehealth: Payer: Self-pay | Admitting: Internal Medicine

## 2014-01-20 ENCOUNTER — Other Ambulatory Visit: Payer: Self-pay | Admitting: Internal Medicine

## 2014-01-20 DIAGNOSIS — H9190 Unspecified hearing loss, unspecified ear: Secondary | ICD-10-CM

## 2014-01-20 DIAGNOSIS — H9319 Tinnitus, unspecified ear: Secondary | ICD-10-CM

## 2014-01-20 NOTE — Telephone Encounter (Signed)
Pt has an appt for a hearing test on May 20 at [facility].  She needs a referral faxed to them.

## 2014-01-20 NOTE — Telephone Encounter (Signed)
Referral to whom Is it for hearing loss?

## 2014-01-20 NOTE — Telephone Encounter (Signed)
She has an appt @ [facility] and their fax is [phone]

## 2014-01-29 ENCOUNTER — Ambulatory Visit
Admission: RE | Admit: 2014-01-29 | Discharge: 2014-01-29 | Disposition: A | Discharge status: Home / Self Care | Payer: Medicare Other | Source: Ambulatory Visit | Attending: Obstetrics and Gynecology | Admitting: Obstetrics and Gynecology

## 2014-01-29 DIAGNOSIS — Z9012 Acquired absence of left breast and nipple: Secondary | ICD-10-CM

## 2014-01-29 DIAGNOSIS — Z9889 Other specified postprocedural states: Secondary | ICD-10-CM

## 2014-01-29 DIAGNOSIS — Z853 Personal history of malignant neoplasm of breast: Secondary | ICD-10-CM

## 2014-02-09 ENCOUNTER — Other Ambulatory Visit: Payer: Self-pay | Admitting: Obstetrics and Gynecology

## 2014-04-22 ENCOUNTER — Encounter: Payer: Self-pay | Admitting: Internal Medicine

## 2014-04-22 ENCOUNTER — Other Ambulatory Visit (INDEPENDENT_AMBULATORY_CARE_PROVIDER_SITE_OTHER): Payer: Medicare Other

## 2014-04-22 ENCOUNTER — Ambulatory Visit (INDEPENDENT_AMBULATORY_CARE_PROVIDER_SITE_OTHER): Payer: Medicare Other | Admitting: Internal Medicine

## 2014-04-22 VITALS — BP 128/80 | HR 99 | Temp 98.3°F | Wt 156.2 lb

## 2014-04-22 DIAGNOSIS — R233 Spontaneous ecchymoses: Secondary | ICD-10-CM

## 2014-04-22 DIAGNOSIS — M150 Primary generalized (osteo)arthritis: Secondary | ICD-10-CM

## 2014-04-22 DIAGNOSIS — R238 Other skin changes: Secondary | ICD-10-CM

## 2014-04-22 DIAGNOSIS — M159 Polyosteoarthritis, unspecified: Secondary | ICD-10-CM

## 2014-04-22 DIAGNOSIS — Z5181 Encounter for therapeutic drug level monitoring: Secondary | ICD-10-CM

## 2014-04-22 DIAGNOSIS — M7580 Other shoulder lesions, unspecified shoulder: Secondary | ICD-10-CM

## 2014-04-22 DIAGNOSIS — M25819 Other specified joint disorders, unspecified shoulder: Secondary | ICD-10-CM

## 2014-04-22 DIAGNOSIS — F411 Generalized anxiety disorder: Secondary | ICD-10-CM

## 2014-04-22 DIAGNOSIS — M7541 Impingement syndrome of right shoulder: Secondary | ICD-10-CM

## 2014-04-22 LAB — CBC WITH DIFFERENTIAL/PLATELET
BASOS ABS: 0 10*3/uL (ref 0.0–0.1)
Basophils Relative: 0.3 % (ref 0.0–3.0)
EOS ABS: 0.2 10*3/uL (ref 0.0–0.7)
Eosinophils Relative: 2.5 % (ref 0.0–5.0)
HCT: 40.2 % (ref 36.0–46.0)
Hemoglobin: 13.5 g/dL (ref 12.0–15.0)
LYMPHS PCT: 29 % (ref 12.0–46.0)
Lymphs Abs: 1.8 10*3/uL (ref 0.7–4.0)
MCHC: 33.7 g/dL (ref 30.0–36.0)
MCV: 92.3 fl (ref 78.0–100.0)
MONOS PCT: 8.5 % (ref 3.0–12.0)
Monocytes Absolute: 0.5 10*3/uL (ref 0.1–1.0)
NEUTROS PCT: 59.7 % (ref 43.0–77.0)
Neutro Abs: 3.6 10*3/uL (ref 1.4–7.7)
Platelets: 187 10*3/uL (ref 150.0–400.0)
RBC: 4.36 Mil/uL (ref 3.87–5.11)
RDW: 12.9 % (ref 11.5–15.5)
WBC: 6.1 10*3/uL (ref 4.0–10.5)

## 2014-04-22 LAB — PROTIME-INR
INR: 1 ratio (ref 0.8–1.0)
PROTHROMBIN TIME: 11.1 s (ref 9.6–13.1)

## 2014-04-22 MED ORDER — FLUOXETINE HCL 20 MG PO CAPS: ORAL_CAPSULE | ORAL | Status: AC

## 2014-04-22 NOTE — Progress Notes (Signed)
Pre visit review using our clinic review tool, if applicable. No additional management support is needed unless otherwise documented below in the visit note. 

## 2014-04-22 NOTE — Patient Instructions (Addendum)
You have medical  clearance for a graduated exercise program.  Use an anti-inflammatory cream such as Aspercreme or Zostrix cream twice a day to the affected area as needed. In lieu of this warm moist compresses or  hot water bottle can be used. Do not apply ice .

## 2014-04-22 NOTE — Progress Notes (Signed)
   Subjective:    Patient ID: Brooke Carpenter, female    DOB: 1946-03-11, 68 y.o.   MRN: [id number]  HPI  She's had increased bruising for least a year in the context of taking anti-inflammatory agents for her right shoulder pain. She seen Dr. Tamala Julian, sports specialist for this.  The pain is described as up to a level IV-5; it does not radiate. It lasts seconds.  With the anti-inflammatory agents she has had the increased bruising over the forearms. Additionally she's had some trauma as she has been keeping her daughter's puppy.  She had stopped her fluoxetine but wants to restart this as she feels it helps her anxiety. She previously on Tramadol but this is not an option with the fluoxetine .She states she would rather not take Tramadol if it meant Fluoxetine could not be continued due to potential drug:drug interaction.    She plans to start a walking program and needs clearance.    Review of Systems She is physically active has no chest pain, palpitations, shortness of breath  Epistaxis, hemoptysis, hematuria, melena, or rectal bleeding denied. No unexplained weight loss, significant dyspepsia,dysphagia, or abdominal pain.  There is no abnormal  bleeding or difficulty stopping bleeding with injury.        Objective:   Physical Exam Positive findings: She has bilateral hearing aids. 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 She has marked osteoarthritic changes of the hands. Pain with ROM R shoulder  General appearance is one of good health and nourishment w/o distress. Eyes: No conjunctival inflammation or scleral icterus is present. Oral exam: Dental hygiene is good; lips and gums are healthy appearing.There is no oropharyngeal erythema or exudate noted.  Heart:  Normal rate and regular rhythm. S1 and S2 normal without gallop, murmur, click, rub or other extra sounds  Lungs:Chest clear to  auscultation; no wheezes, rhonchi,rales ,or rubs present.No increased work of breathing.  Abdomen: bowel sounds normal, soft and non-tender without masses, organomegaly or hernias noted.  No guarding or rebound .  Musculoskeletal: Able to lie flat and sit up without help. Negative straight leg raising bilaterally. Gait normal Skin:Warm & dry.  Intact without suspicious lesions or rashes ; no jaundice or tenting She has no lymphadenopathy about the neck or axilla. There are no epitrochlear lymph nodes.            Assessment & Plan:  #1 bruising secondary to aging change and to nonsteroidals #2 anxiety #3 DJD Plan: CBC, PT and PTT will be checked Fluoxetine will be restarted. Mobic also not option due to SSRI. Topical agents for the shoulder recommended.

## 2014-04-23 DIAGNOSIS — M159 Polyosteoarthritis, unspecified: Secondary | ICD-10-CM | POA: Insufficient documentation

## 2014-05-05 IMAGING — CR DG CHEST 2V
2 series · 2 of 2 positions shown · non-contrast
Comparison: none

CLINICAL DATA: Cough.  Shortness of breath.  Congestion.  Chest
pain.  Recent vomiting.  Breast lumpectomy in 5777.

CHEST - 2 VIEW

[w chest pa]
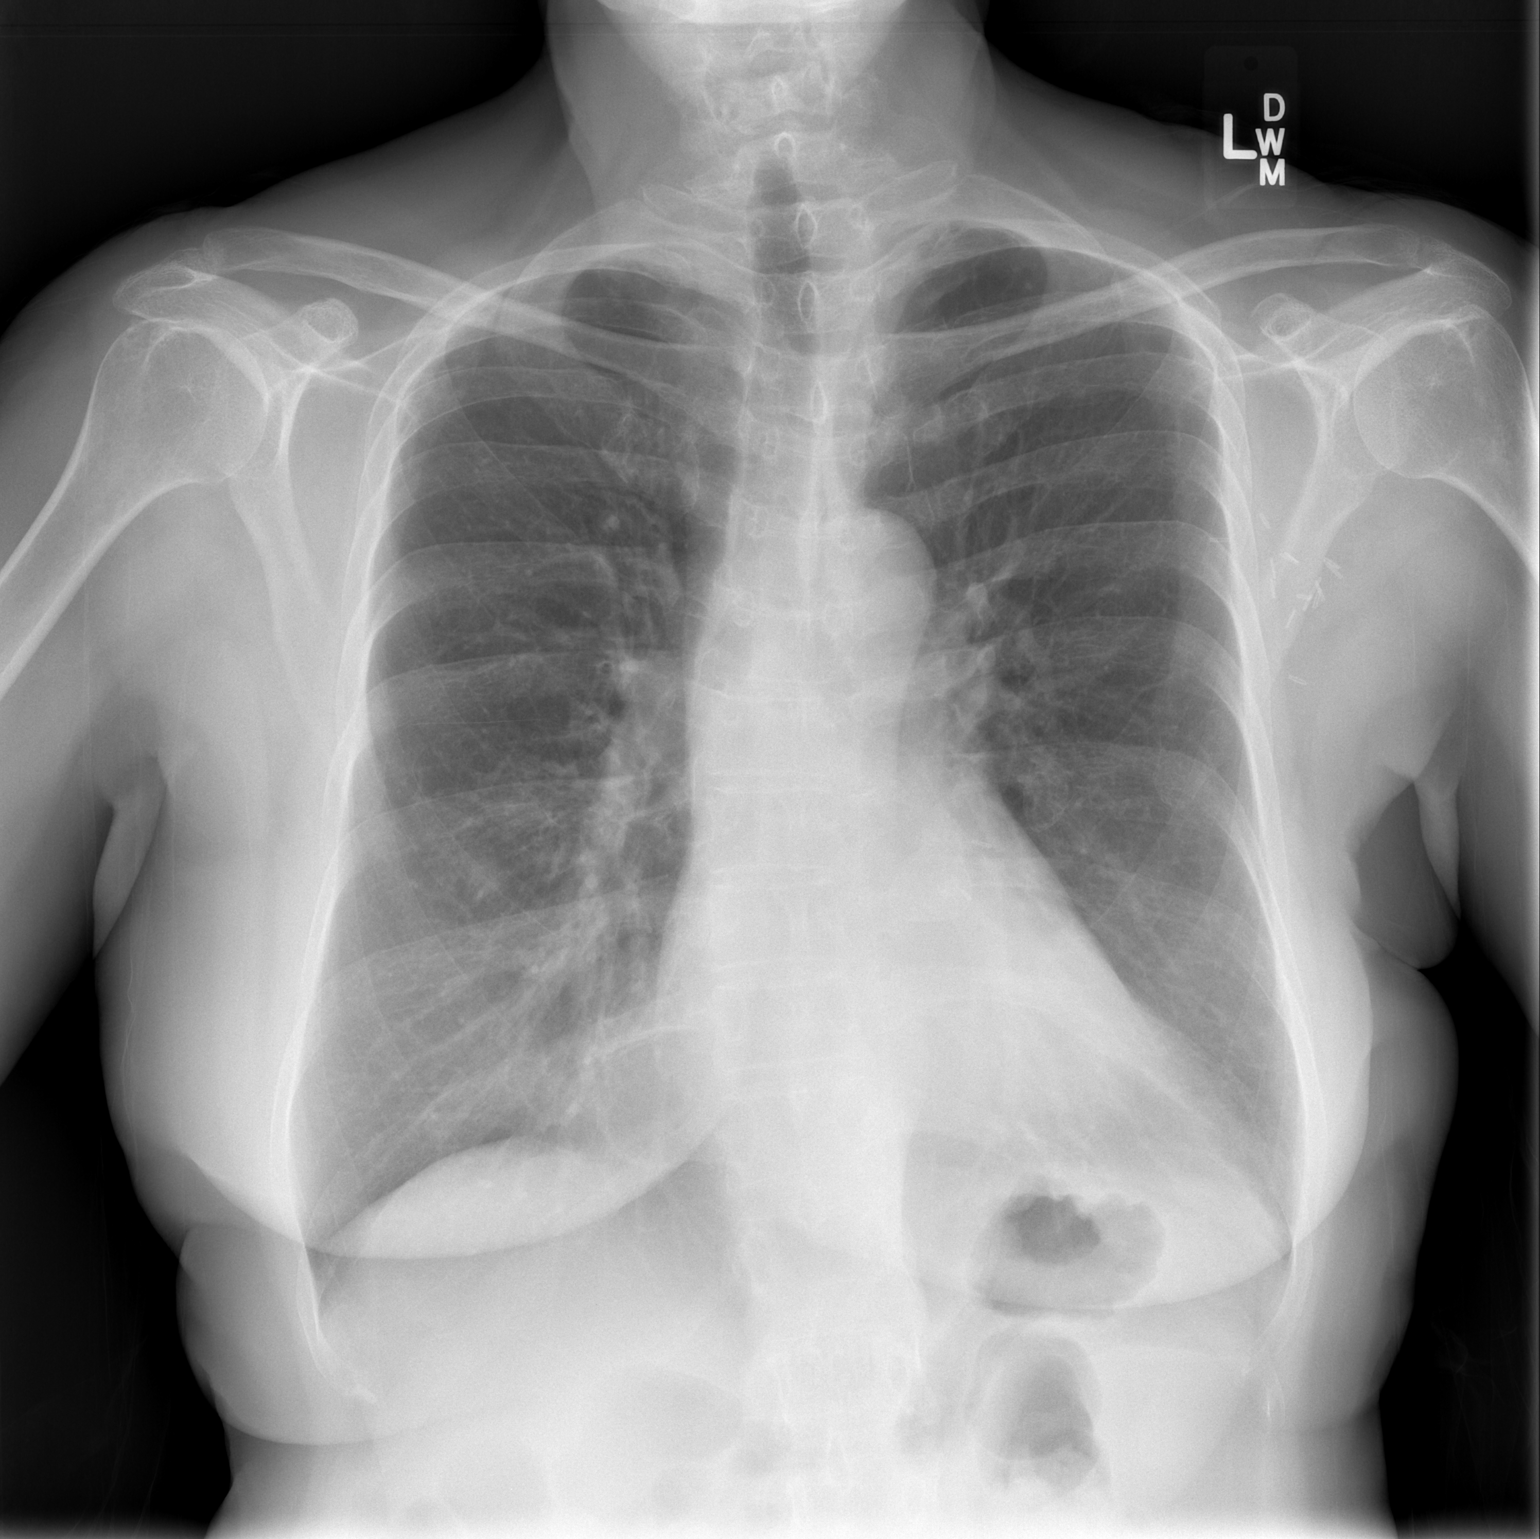

[w chest lat]
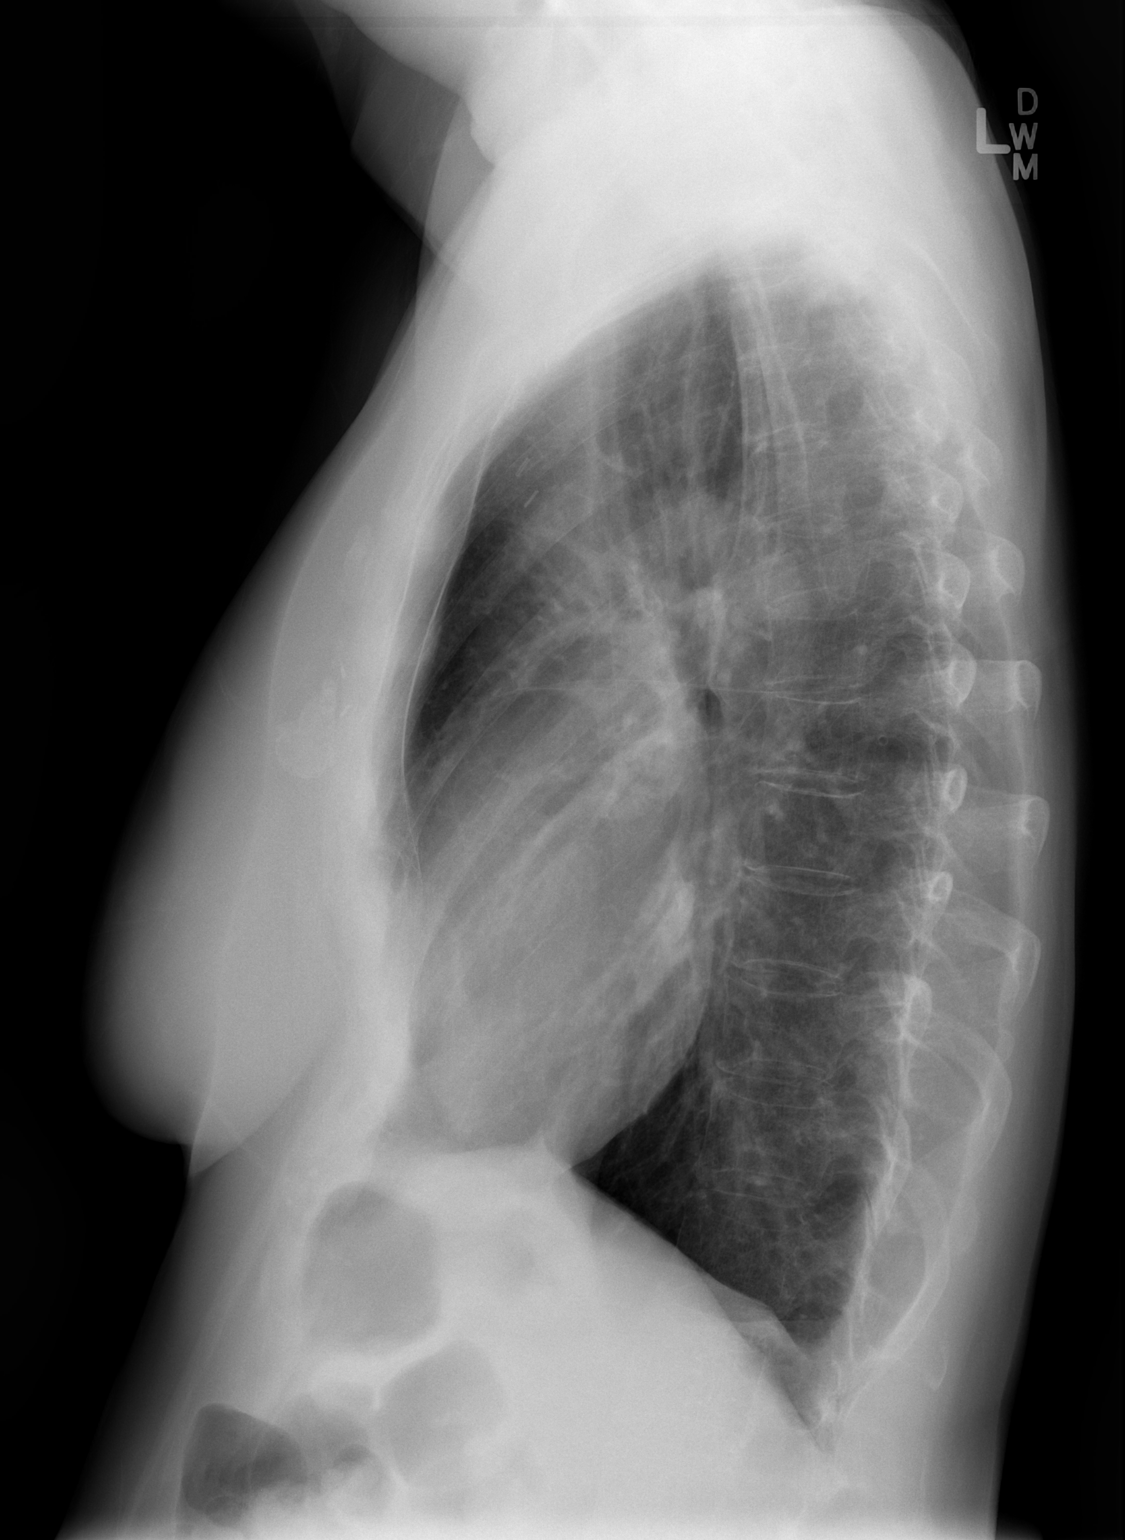

[2 of 2 positions shown; findings below may reference images not displayed]

FINDINGS: Left axillary clips noted.  Mild dextroconvex thoracic
scoliosis is present.  Mild rotary component noted.

Density along the right lower heart border is ascribed to
epicardial adipose tissue and accentuation of heart border
secondary to pectus excavatum.

An indistinct 9 mm nodularity projecting of the right mid lung is
above the expected level of the nipple shadow.  Stable calcific
density projects over the upper breast region on the lateral
projection.
IMPRESSION: 1.  Possible 9 mm right midlung nodule.  Given the patient's
history of breast cancer, chest CT (with contrast if feasible) is
recommended for further workup.
2.  Mild dextroconvex thoracic scoliosis with rotary component.

## 2014-05-14 ENCOUNTER — Other Ambulatory Visit: Payer: Self-pay | Admitting: Internal Medicine

## 2014-06-01 ENCOUNTER — Ambulatory Visit (INDEPENDENT_AMBULATORY_CARE_PROVIDER_SITE_OTHER): Payer: Federal, State, Local not specified - PPO | Admitting: Family Medicine

## 2014-06-01 ENCOUNTER — Encounter: Payer: Self-pay | Admitting: Family Medicine

## 2014-06-01 ENCOUNTER — Other Ambulatory Visit (INDEPENDENT_AMBULATORY_CARE_PROVIDER_SITE_OTHER): Payer: Medicare Other

## 2014-06-01 VITALS — BP 120/78 | HR 88 | Ht 64.0 in | Wt 158.0 lb

## 2014-06-01 DIAGNOSIS — M25511 Pain in right shoulder: Secondary | ICD-10-CM

## 2014-06-01 DIAGNOSIS — IMO0002 Reserved for concepts with insufficient information to code with codable children: Secondary | ICD-10-CM

## 2014-06-01 DIAGNOSIS — M25519 Pain in unspecified shoulder: Secondary | ICD-10-CM

## 2014-06-01 DIAGNOSIS — M7551 Bursitis of right shoulder: Secondary | ICD-10-CM

## 2014-06-01 DIAGNOSIS — M75100 Unspecified rotator cuff tear or rupture of unspecified shoulder, not specified as traumatic: Secondary | ICD-10-CM

## 2014-06-01 MED ORDER — DICLOFENAC SODIUM 2 % TD SOLN: 2.0000 "application " | Freq: Two times a day (BID) | TRANSDERMAL | Status: AC

## 2014-06-01 NOTE — Patient Instructions (Addendum)
Good to see you Have a great trip! Ice 20 minutes 2 times daily. Usually after activity and before bed. Exercises 3 times a week.  Pennsaid twice daily for pain.  Stop if hurt your stomach, or easy bruising.  Come back if you need me.

## 2014-06-01 NOTE — Progress Notes (Signed)
[facility] [place], [place] [address] Phone: [phone] Subjective:    CC: Right shoulder pain, follow up.   DZH:[id number] Brooke Carpenter is a 68 y.o. female coming in with complaint of right shoulder pain. Patient was seen previously and was diagnosed with a subacromial bursitis. Patient at last visit have completely resolve any pain in her shoulder. This was greater than 4 months ago. Patient states she started to have pain coming back slowly again. Patient did have subacromial bursitis and states that she has not been doing exercises. Patient had to stop her anti-inflammatories secondary to easy bruising. Patient states since stopping anti-inflammatories as well as the exercises the pain started to come back again. Patient describes it as a dull aching sensation that can be sharp with certain movements. Denies any significant weakness. Patient thinks doing some yard exercises and working in her garden could contribute to the pain. Patient denies any new symptoms from previous but she seems to be worsening from the previous with.  Past medical history, social, surgical and family history all reviewed in electronic medical record.   Review of Systems: No headache, visual changes, nausea, vomiting, diarrhea, constipation, dizziness, abdominal pain, skin rash, fevers, chills, night sweats, weight loss, swollen lymph nodes, body aches, joint swelling, muscle aches, chest pain, shortness of breath, mood changes.   Objective Blood pressure 120/78, pulse 88, height 5\' 4"  (1.626 m), weight 158 lb (71.668 kg), SpO2 94.00%.  General: No apparent distress alert and oriented x3 mood and affect normal, dressed appropriately.  HEENT: Pupils equal, extraocular movements intact  Respiratory: Patient's speak in full sentences and does not appear short of breath  Cardiovascular: No lower extremity edema, non tender, no erythema  Skin: Warm dry intact with no  signs of infection or rash on extremities or on axial skeleton.  Abdomen: Soft nontender  Neuro: Cranial nerves II through XII are intact, neurovascularly intact in all extremities with 2+ DTRs and 2+ pulses.  Lymph: No lymphadenopathy of posterior or anterior cervical chain or axillae bilaterally.  Gait normal with good balance and coordination.  MSK:  Non tender with full range of motion and good stability and symmetric strength and tone of  elbows, wrist, hip, knee and ankles bilaterally.  Shoulder: Right Inspection reveals no abnormalities, atrophy or asymmetry. Palpation is normal with no tenderness over AC joint or bicipital groove. ROM is full in all planes passively. Rotator cuff strength normal throughout. signs of impingement with positive Neer and Hawkin's tests, but negative empty can sign. Speeds and Yergason's tests normal. No labral pathology noted with negative Obrien's, negative clunk and good stability. Normal scapular function observed. No painful arc and no drop arm sign. No apprehension sign  MSK US performed of: Right This study was ordered, performed, and interpreted by Charlann Boxer D.O.  Shoulder:   Supraspinatus:  Appears normal on long and transverse views, Bursal bulge seen with shoulder abduction on impingement view. Infraspinatus:  Appears normal on long and transverse views. Significant increase in Doppler flow Subscapularis:  Appears normal on long and transverse views. Positive bursa Teres Minor:  Appears normal on long and transverse views. AC joint:  Capsule undistended, no geyser sign. Glenohumeral Joint:  Moderate osteoarthritis Glenoid Labrum:  Intact without visualized tears. Biceps Tendon:  Appears normal on long and transverse views, no fraying of tendon, tendon located in intertubercular groove, no subluxation with shoulder internal or external rotation.  Impression: Subacromial bursitis with underlying osteoarthritis  Procedure: Real-time  Ultrasound Guided Injection of right glenohumeral joint Device: GE Logiq E  Ultrasound guided injection is preferred based studies that show increased duration, increased effect, greater accuracy, decreased procedural pain, increased response rate with ultrasound guided versus blind injection.  Verbal informed consent obtained.  Time-out conducted.  Noted no overlying erythema, induration, or other signs of local infection.  Skin prepped in a sterile fashion.  Local anesthesia: Topical Ethyl chloride.  With sterile technique and under real time ultrasound guidance:  Joint visualized.  23g 1  inch needle inserted posterior approach. Pictures taken for needle placement. Patient did have injection of 2 cc of 1% lidocaine, 2 cc of 0.5% Marcaine, and 1.0 cc of Kenalog 40 mg/dL. Completed without difficulty  Pain immediately resolved suggesting accurate placement of the medication.  Advised to call if fevers/chills, erythema, induration, drainage, or persistent bleeding.  Images permanently stored and available for review in the ultrasound unit.  Impression: Technically successful ultrasound guided injection.     Impression and Recommendations:

## 2014-06-01 NOTE — Assessment & Plan Note (Signed)
Patient was given an injection today in her shoulder. Patient was given rotator cuff strengthening exercises. We discussed the importance of proper lifting technique as well as an icing protocol. Patient was given topical anti-inflammatories as well. We once again discussed the possibility of x-rays in my encourage her to have them done today the patient declined. Patient is traveling and will not be back for 6 weeks. Patient will come back in 6 weeks for further evaluation. At that time if continuing to have pain I would consider x-rays, as well as formal physical therapy.  Spent greater than 25 minutes with patient face-to-face and had greater than 50% of counseling including as described above in assessment and plan.

## 2014-06-18 ENCOUNTER — Ambulatory Visit (INDEPENDENT_AMBULATORY_CARE_PROVIDER_SITE_OTHER): Payer: Federal, State, Local not specified - PPO | Admitting: Surgery

## 2014-07-17 ENCOUNTER — Other Ambulatory Visit: Payer: Self-pay

## 2014-12-18 ENCOUNTER — Telehealth: Payer: Self-pay

## 2014-12-18 NOTE — Telephone Encounter (Signed)
Left message for pt to call back if she still wants flu vaccine 

## 2015-03-29 ENCOUNTER — Other Ambulatory Visit: Payer: Self-pay

## 2015-04-19 ENCOUNTER — Encounter: Payer: Self-pay | Admitting: Genetic Counselor

## 2016-04-08 IMAGING — MG MM DIGITAL DIAGNOSTIC UNILAT*R*
3 series · 3 of 3 positions shown · non-contrast
Comparison: Priors

CLINICAL DATA: Patient with history of right breast lumpectomy in
8556.

EXAM:
DIGITAL DIAGNOSTIC  RIGHT MAMMOGRAM WITH CAD

[R CC]
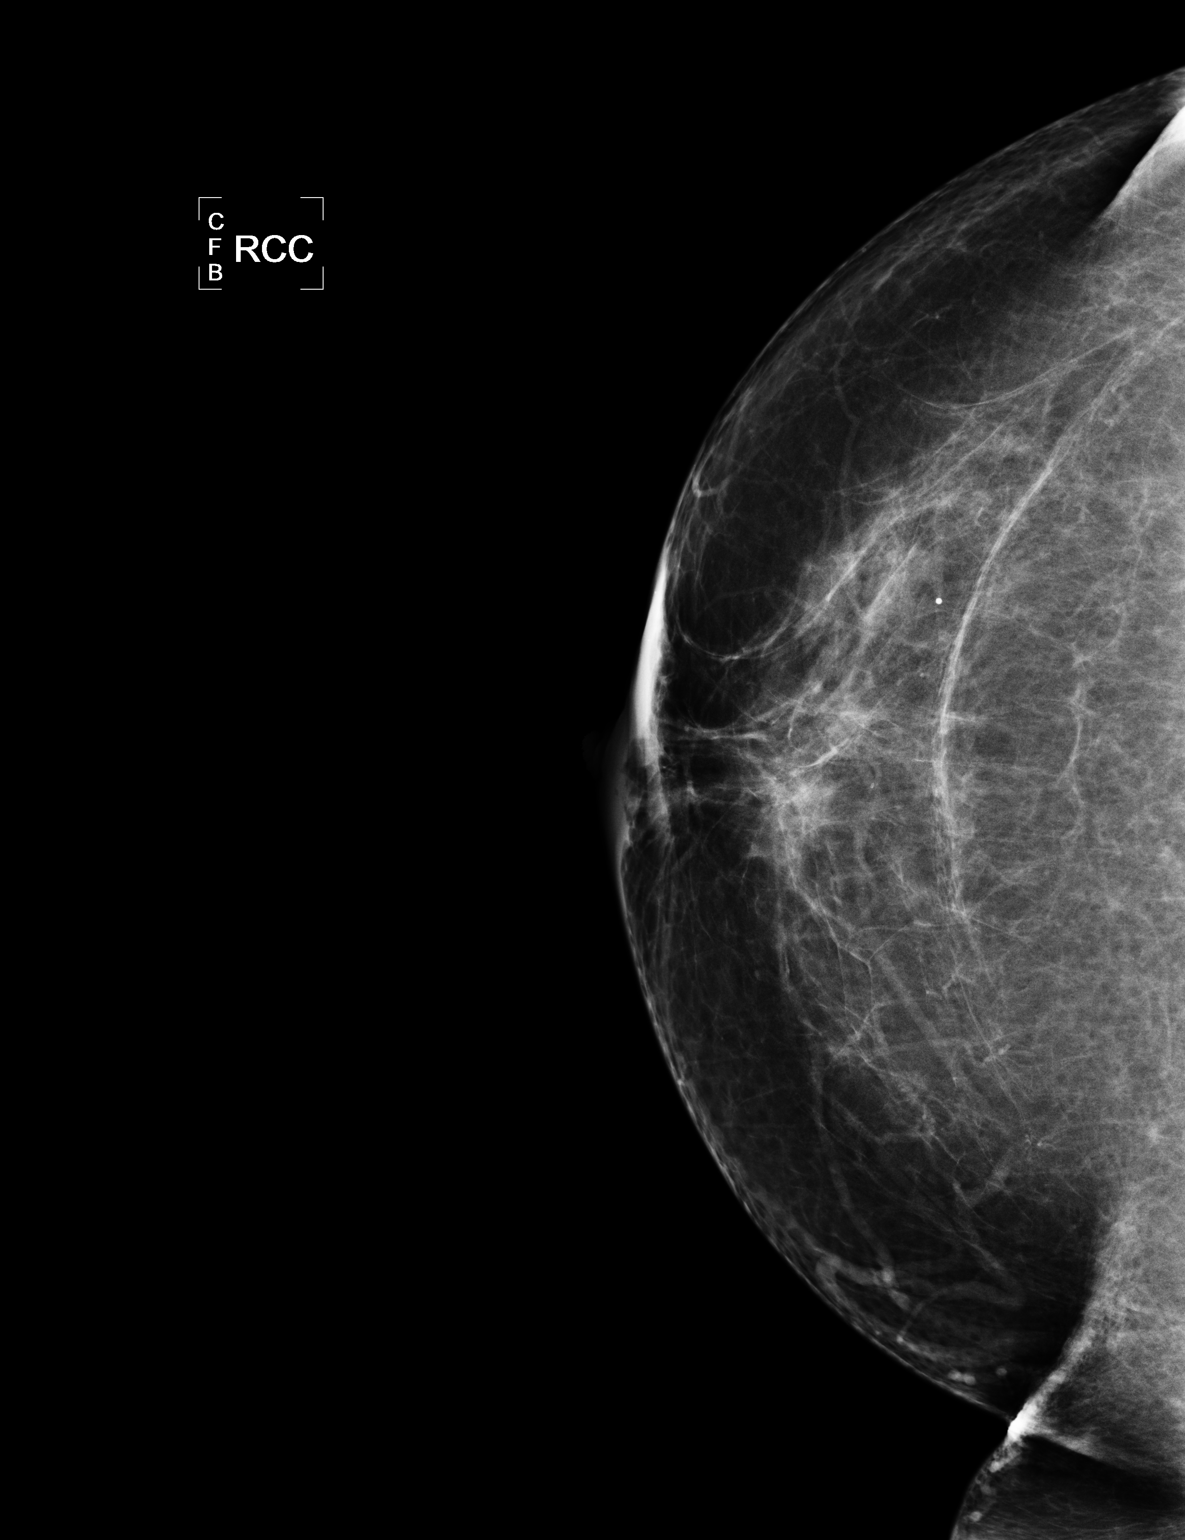

[R MLO]
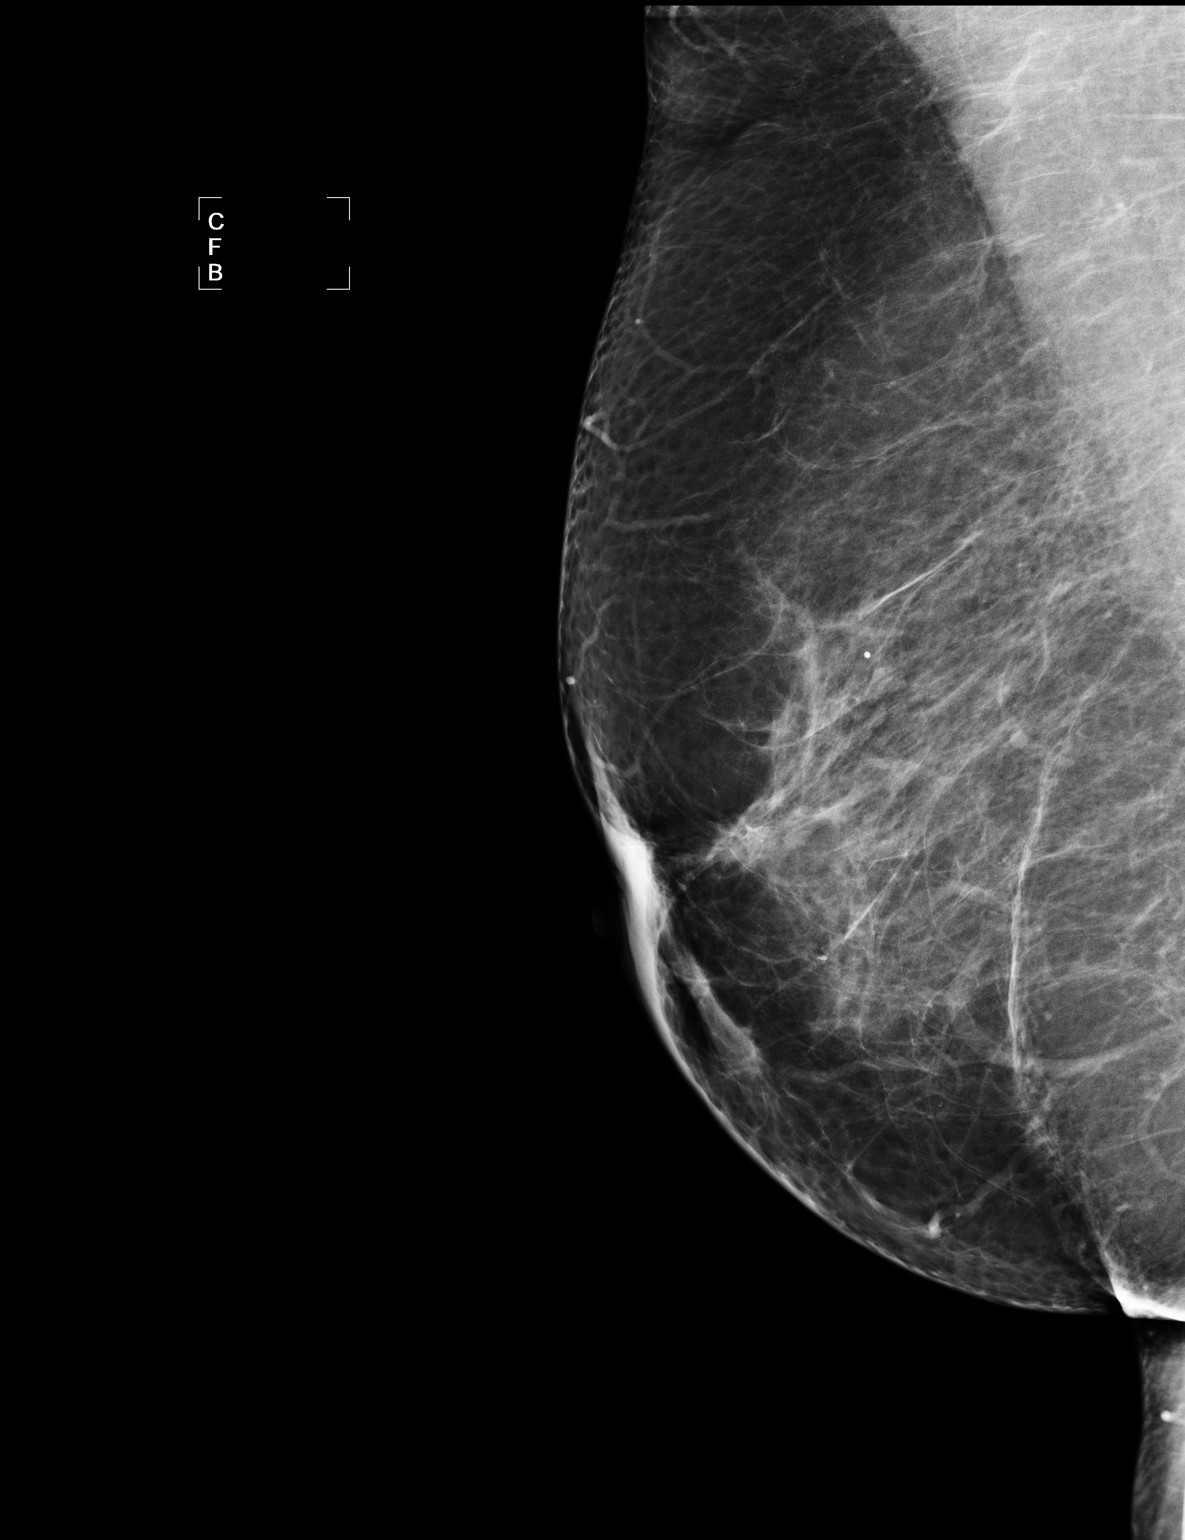

[R TAN]
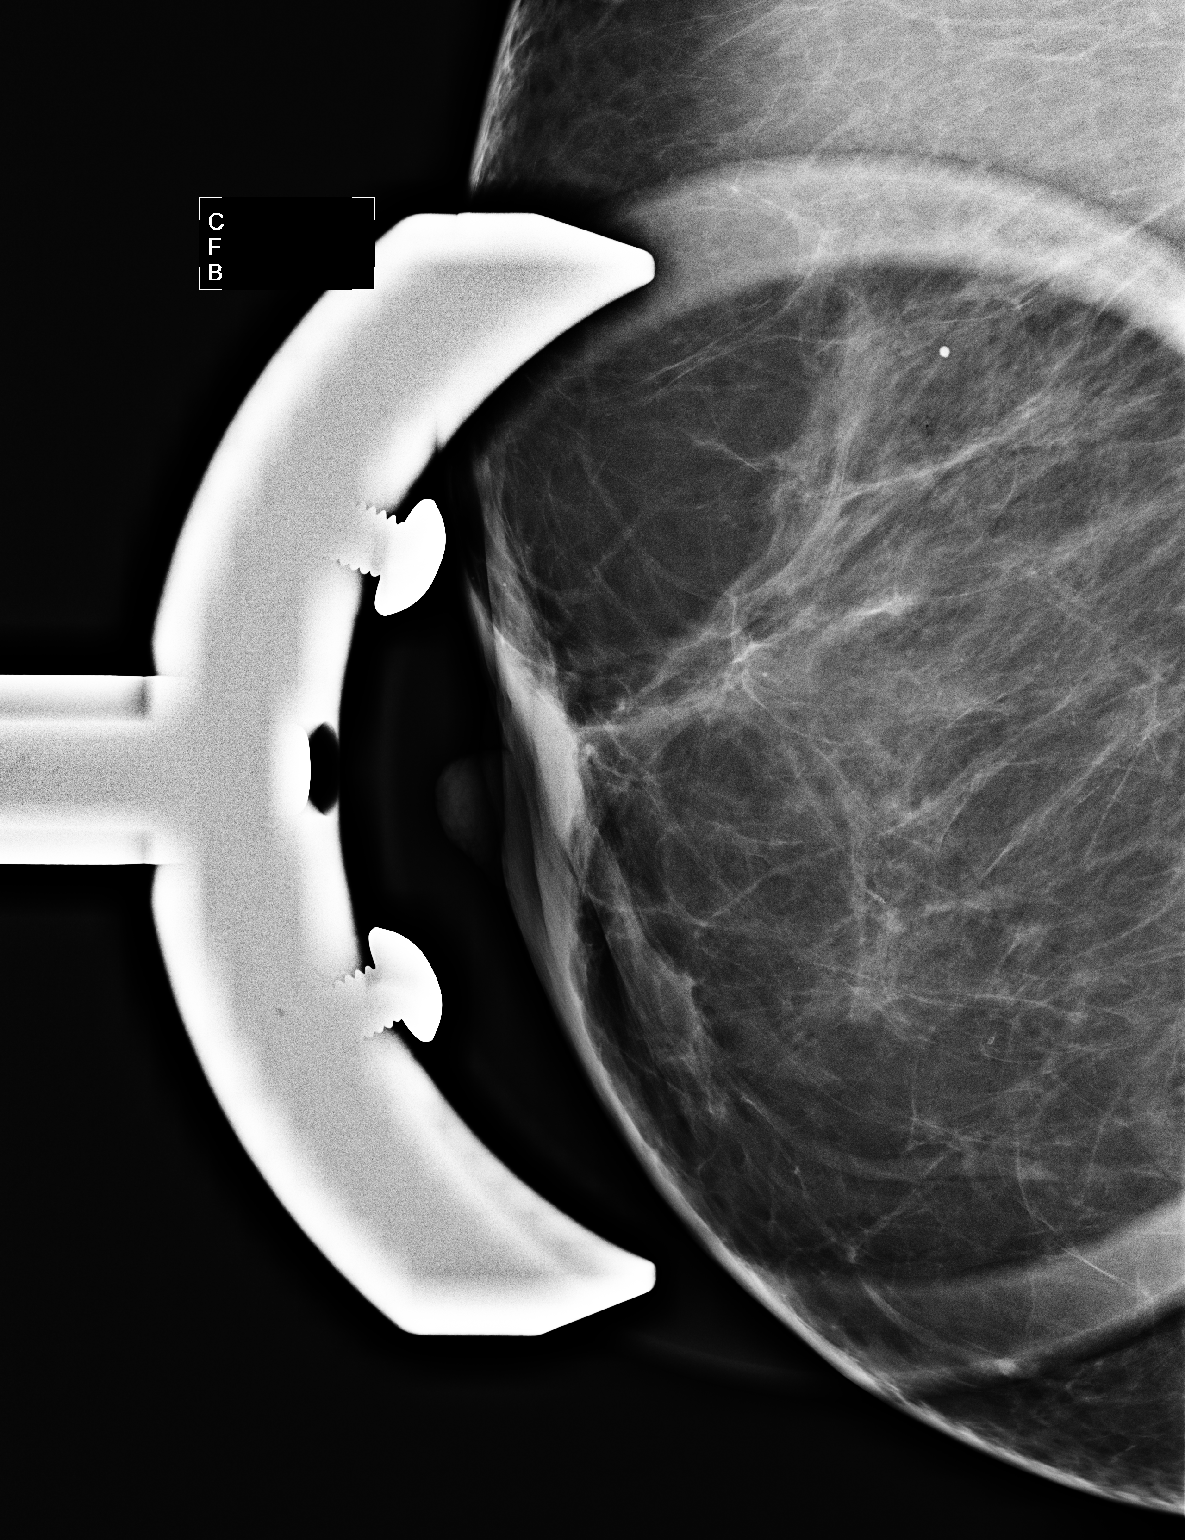

[3 of 3 positions shown; findings below may reference images not displayed]

ACR Breast Density Category b: There are scattered areas of
fibroglandular density.
FINDINGS: Postlumpectomy changes right breast. No concerning masses,
calcifications or nonsurgical architectural distortion.

Mammographic images were processed with CAD.
IMPRESSION: Postlumpectomy changes right breast.

No mammographic evidence for malignancy.

RECOMMENDATION:
Screening mammogram in one year.(Code:[id number])

I have discussed the findings and recommendations with the patient.
Results were also provided in writing at the conclusion of the
visit. If applicable, a reminder letter will be sent to the patient
regarding the next appointment.

BI-RADS CATEGORY  2: Benign.
